# Patient Record
Sex: Male | Born: 1985 | Race: White | Hispanic: No | Marital: Single | Smoking: Current every day smoker
Health system: Southern US, Community
[De-identification: ages and names within clinical notes are randomized; demographics above are authoritative.]

## PROBLEM LIST (undated history)

## (undated) DIAGNOSIS — F419 Anxiety disorder, unspecified: Secondary | ICD-10-CM

## (undated) DIAGNOSIS — M1990 Unspecified osteoarthritis, unspecified site: Secondary | ICD-10-CM

## (undated) DIAGNOSIS — G629 Polyneuropathy, unspecified: Secondary | ICD-10-CM

## (undated) HISTORY — PX: TONSILLECTOMY: SUR1361

---

## 2011-10-07 ENCOUNTER — Emergency Department (HOSPITAL_COMMUNITY): Payer: Self-pay

## 2011-10-07 ENCOUNTER — Emergency Department (HOSPITAL_COMMUNITY)
Admission: EM | Admit: 2011-10-07 | Discharge: 2011-10-07 | Disposition: A | Discharge status: Home / Self Care | Payer: Self-pay | Attending: Emergency Medicine | Admitting: Emergency Medicine

## 2011-10-07 ENCOUNTER — Encounter (HOSPITAL_COMMUNITY): Payer: Self-pay | Admitting: Emergency Medicine

## 2011-10-07 DIAGNOSIS — F17200 Nicotine dependence, unspecified, uncomplicated: Secondary | ICD-10-CM | POA: Insufficient documentation

## 2011-10-07 DIAGNOSIS — R404 Transient alteration of awareness: Secondary | ICD-10-CM | POA: Insufficient documentation

## 2011-10-07 DIAGNOSIS — F1920 Other psychoactive substance dependence, uncomplicated: Secondary | ICD-10-CM | POA: Insufficient documentation

## 2011-10-07 DIAGNOSIS — T1490XA Injury, unspecified, initial encounter: Secondary | ICD-10-CM | POA: Insufficient documentation

## 2011-10-07 DIAGNOSIS — M545 Low back pain, unspecified: Secondary | ICD-10-CM | POA: Insufficient documentation

## 2011-10-07 DIAGNOSIS — J329 Chronic sinusitis, unspecified: Secondary | ICD-10-CM | POA: Insufficient documentation

## 2011-10-07 DIAGNOSIS — K029 Dental caries, unspecified: Secondary | ICD-10-CM | POA: Insufficient documentation

## 2011-10-07 HISTORY — DX: Unspecified osteoarthritis, unspecified site: M19.90

## 2011-10-07 LAB — RAPID URINE DRUG SCREEN, HOSP PERFORMED
Opiates: NOT DETECTED
Tetrahydrocannabinol: NOT DETECTED

## 2011-10-07 LAB — ETHANOL: Alcohol, Ethyl (B): <11 mg/dL (ref 0–11)

## 2011-10-07 MED ORDER — SODIUM CHLORIDE 0.9 % IV BOLUS (SEPSIS)
1000.0000 mL | Freq: Once | INTRAVENOUS | Status: AC
  Administered 2011-10-07: 1000 mL via INTRAVENOUS
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
## 2011-10-07 NOTE — ED Notes (Signed)
Pt denies si.
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
## 2011-10-07 NOTE — ED Notes (Signed)
Patient refusing in/out foley. Stating he can use the bathroom himself. Patient insisted on sitting on side of bed to urinate. Pt nodding in and out of sleep. Unable to use urinal.

## 2011-10-07 NOTE — ED Notes (Addendum)
Patient forgot socks in room. Unable to locate patient after discharge. Socks in patient belonging bag and labeled. Bag located at blue nurse's station. Rn notified

## 2011-10-07 NOTE — ED Notes (Signed)
Attempted in and out cath. Pt woke up and refused cath.  Tried to have pt urinate, but pt unable.  Pt awake, but still drowsy and uncoordinated.
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
## 2011-10-07 NOTE — ED Notes (Signed)
Pt's mother has called and asked to speak w/the pt.  Call was transferred in the room and pt awoke to answer it.  Pt was asleep but will awake to verbal stimuli and is clearly groggy.

## 2011-10-07 NOTE — ED Notes (Signed)
Pt became agitated after talking to family to get ride home.  Security was called and pt was escorted out.
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
## 2011-10-07 NOTE — ED Notes (Signed)
Pt unable to urinate at this time.  

## 2011-10-07 NOTE — ED Provider Notes (Signed)
Patient signed out to me by Dr. Read Drivers. He is arousable and answers questions appropriate leg. Estimated to using excessive amounts of benzodiazepines. Concern was voiced by his parent of possible suicidal ideations however patient denies this states that he has too much to live for. I do not believe that he is suicidal at this time. He'll be discharged in stable  2:40 PM Pt now stable for discharge  Toy Baker, MD 10/07/11 1440

## 2011-12-04 ENCOUNTER — Emergency Department (HOSPITAL_COMMUNITY)
Admission: EM | Admit: 2011-12-04 | Discharge: 2011-12-04 | Payer: Self-pay | Attending: Emergency Medicine | Admitting: Emergency Medicine

## 2011-12-04 ENCOUNTER — Encounter (HOSPITAL_COMMUNITY): Payer: Self-pay | Admitting: Emergency Medicine

## 2011-12-04 DIAGNOSIS — R5381 Other malaise: Secondary | ICD-10-CM | POA: Insufficient documentation

## 2011-12-04 DIAGNOSIS — F17200 Nicotine dependence, unspecified, uncomplicated: Secondary | ICD-10-CM | POA: Insufficient documentation

## 2011-12-04 DIAGNOSIS — T43591A Poisoning by other antipsychotics and neuroleptics, accidental (unintentional), initial encounter: Secondary | ICD-10-CM | POA: Insufficient documentation

## 2011-12-04 DIAGNOSIS — F1910 Other psychoactive substance abuse, uncomplicated: Secondary | ICD-10-CM | POA: Insufficient documentation

## 2011-12-04 DIAGNOSIS — T424X4A Poisoning by benzodiazepines, undetermined, initial encounter: Secondary | ICD-10-CM | POA: Insufficient documentation

## 2011-12-04 DIAGNOSIS — R5383 Other fatigue: Secondary | ICD-10-CM | POA: Insufficient documentation

## 2011-12-04 DIAGNOSIS — T424X1A Poisoning by benzodiazepines, accidental (unintentional), initial encounter: Secondary | ICD-10-CM

## 2011-12-04 LAB — COMPREHENSIVE METABOLIC PANEL
ALT: 7 U/L (ref 0–53)
CO2: 27 mEq/L (ref 19–32)
Calcium: 9.3 mg/dL (ref 8.4–10.5)
GFR calc Af Amer: >90 mL/min (ref >90)
GFR calc non Af Amer: >90 mL/min (ref >90)
Glucose, Bld: 78 mg/dL (ref 70–99)
Sodium: 139 mEq/L (ref 135–145)

## 2011-12-04 LAB — CBC
HCT: 41.4 % (ref 39.0–52.0)
Hemoglobin: 14 g/dL (ref 13.0–17.0)
MCH: 31.1 pg (ref 26.0–34.0)
MCV: 92 fL (ref 78.0–100.0)
RBC: 4.5 MIL/uL (ref 4.22–5.81)

## 2011-12-04 LAB — ACETAMINOPHEN LEVEL: Acetaminophen (Tylenol), Serum: <15 ug/mL (ref 10–30)

## 2011-12-04 LAB — RAPID URINE DRUG SCREEN, HOSP PERFORMED
Barbiturates: NOT DETECTED
Cocaine: POSITIVE — AB

## 2011-12-04 MED ORDER — ACTIDOSE WITH SORBITOL 50 GM/240ML PO LIQD
50.0000 g | Freq: Once | ORAL | Status: AC
  Administered 2011-12-04: 50 g via ORAL
  Filled 2011-12-04: qty 240

## 2011-12-04 MED ORDER — SODIUM CHLORIDE 0.9 % IV SOLN
Freq: Once | INTRAVENOUS | Status: AC
  Administered 2011-12-04: 12:00:00 via INTRAVENOUS

## 2011-12-04 NOTE — ED Notes (Signed)
Attempted to obtain urine sample from pt without success

## 2011-12-04 NOTE — ED Notes (Signed)
Attempted to obtain urine sample from pt.  Pt attempted to go but was unable to go.

## 2011-12-04 NOTE — ED Notes (Signed)
Pt eating dinner, stood and ambulated with no assitance

## 2011-12-04 NOTE — ED Notes (Signed)
MD at bedside. 

## 2011-12-04 NOTE — ED Notes (Signed)
Sheriff bedside with pt.
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
## 2011-12-04 NOTE — ED Provider Notes (Signed)
7 PM Patient is awake alert Glasgow Coma Score 15 ambulate without difficulty. A full meal in the emergency department Discharge is stable condition with law-enforcement  Doug Sou, MD 12/04/11 1610

## 2011-12-04 NOTE — ED Notes (Signed)
Requested urine sample.  

## 2011-12-04 NOTE — ED Notes (Signed)
Sheriff at bedside, pt eating sandwich

## 2011-12-04 NOTE — ED Notes (Signed)
Awaiting d/c paperwork 

## 2011-12-04 NOTE — ED Notes (Signed)
Pt presenting to ed from [facility] jail with officer for medical clearance pt very lethargic and sleepy in triage pt states he took unknown amount of xanax. Pt unable to fully answer questions in triage due.

## 2011-12-12 ENCOUNTER — Encounter (HOSPITAL_COMMUNITY): Payer: Self-pay | Admitting: Emergency Medicine

## 2011-12-12 ENCOUNTER — Emergency Department (HOSPITAL_COMMUNITY)
Admission: EM | Admit: 2011-12-12 | Discharge: 2011-12-13 | Disposition: A | Discharge status: Other Acute Inpatient Hospital | Payer: Self-pay | Attending: Emergency Medicine | Admitting: Emergency Medicine

## 2011-12-12 DIAGNOSIS — F3289 Other specified depressive episodes: Secondary | ICD-10-CM | POA: Insufficient documentation

## 2011-12-12 DIAGNOSIS — F329 Major depressive disorder, single episode, unspecified: Secondary | ICD-10-CM | POA: Insufficient documentation

## 2011-12-12 DIAGNOSIS — Z8739 Personal history of other diseases of the musculoskeletal system and connective tissue: Secondary | ICD-10-CM | POA: Insufficient documentation

## 2011-12-12 DIAGNOSIS — R45851 Suicidal ideations: Secondary | ICD-10-CM | POA: Insufficient documentation

## 2011-12-12 DIAGNOSIS — F17200 Nicotine dependence, unspecified, uncomplicated: Secondary | ICD-10-CM | POA: Insufficient documentation

## 2011-12-12 LAB — COMPREHENSIVE METABOLIC PANEL
ALT: 10 U/L (ref 0–53)
AST: 16 U/L (ref 0–37)
CO2: 29 mEq/L (ref 19–32)
Chloride: 100 mEq/L (ref 96–112)
GFR calc non Af Amer: >90 mL/min (ref >90)
Potassium: 4.4 mEq/L (ref 3.5–5.1)
Sodium: 140 mEq/L (ref 135–145)
Total Bilirubin: 0.5 mg/dL (ref 0.3–1.2)

## 2011-12-12 LAB — CBC
Platelets: 240 10*3/uL (ref 150–400)
RBC: 4.65 MIL/uL (ref 4.22–5.81)
WBC: 17.3 10*3/uL — ABNORMAL HIGH (ref 4.0–10.5)

## 2011-12-12 LAB — RAPID URINE DRUG SCREEN, HOSP PERFORMED
Barbiturates: NOT DETECTED
Tetrahydrocannabinol: NOT DETECTED
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
## 2011-12-13 ENCOUNTER — Encounter (HOSPITAL_COMMUNITY): Payer: Self-pay | Admitting: Licensed Clinical Social Worker

## 2011-12-13 ENCOUNTER — Encounter (HOSPITAL_COMMUNITY): Payer: Self-pay | Admitting: *Deleted

## 2011-12-13 ENCOUNTER — Inpatient Hospital Stay (HOSPITAL_COMMUNITY)
Admission: RE | Admit: 2011-12-13 | Discharge: 2011-12-18 | DRG: 885 | Disposition: A | Discharge status: Home / Self Care | Payer: Medicaid Other | Source: Ambulatory Visit | Attending: Psychiatry | Admitting: Psychiatry

## 2011-12-13 DIAGNOSIS — K039 Disease of hard tissues of teeth, unspecified: Secondary | ICD-10-CM | POA: Diagnosis present

## 2011-12-13 DIAGNOSIS — F1520 Other stimulant dependence, uncomplicated: Secondary | ICD-10-CM | POA: Diagnosis present

## 2011-12-13 DIAGNOSIS — F314 Bipolar disorder, current episode depressed, severe, without psychotic features: Secondary | ICD-10-CM | POA: Diagnosis present

## 2011-12-13 DIAGNOSIS — F3113 Bipolar disorder, current episode manic without psychotic features, severe: Principal | ICD-10-CM | POA: Diagnosis present

## 2011-12-13 MED ORDER — ALUM & MAG HYDROXIDE-SIMETH 200-200-20 MG/5ML PO SUSP: 30.0000 mL | ORAL | Status: DC | PRN

## 2011-12-13 MED ORDER — CITALOPRAM HYDROBROMIDE 20 MG PO TABS
20.0000 mg | ORAL_TABLET | Freq: Every day | ORAL | Status: DC
  Administered 2011-12-13: 20 mg via ORAL
  Filled 2011-12-13 (×2): qty 1

## 2011-12-13 MED ORDER — NICOTINE 21 MG/24HR TD PT24
21.0000 mg | MEDICATED_PATCH | Freq: Every day | TRANSDERMAL | Status: DC
Start: 2011-12-13 — End: 2011-12-18
  Administered 2011-12-14 – 2011-12-18 (×5): 21 mg via TRANSDERMAL
  Filled 2011-12-13 (×7): qty 1

## 2011-12-13 MED ORDER — NICOTINE 21 MG/24HR TD PT24: 21.0000 mg | MEDICATED_PATCH | Freq: Every day | TRANSDERMAL | Status: DC | PRN

## 2011-12-13 MED ORDER — ZOLPIDEM TARTRATE 5 MG PO TABS: 5.0000 mg | ORAL_TABLET | Freq: Every evening | ORAL | Status: DC | PRN

## 2011-12-13 MED ORDER — LORAZEPAM 1 MG PO TABS
1.0000 mg | ORAL_TABLET | Freq: Three times a day (TID) | ORAL | Status: DC | PRN
  Administered 2011-12-13: 1 mg via ORAL
  Filled 2011-12-13: qty 1

## 2011-12-13 MED ORDER — HYDROXYZINE HCL 25 MG PO TABS
25.0000 mg | ORAL_TABLET | Freq: Three times a day (TID) | ORAL | Status: DC | PRN
  Administered 2011-12-13 – 2011-12-15 (×6): 25 mg via ORAL

## 2011-12-13 MED ORDER — NICOTINE 21 MG/24HR TD PT24
MEDICATED_PATCH | TRANSDERMAL | Status: AC
  Administered 2011-12-13: 20:00:00
  Filled 2011-12-13: qty 1

## 2011-12-13 MED ORDER — ACETAMINOPHEN 325 MG PO TABS: 650.0000 mg | ORAL_TABLET | ORAL | Status: DC | PRN

## 2011-12-13 MED ORDER — MAGNESIUM HYDROXIDE 400 MG/5ML PO SUSP: 30.0000 mL | Freq: Every day | ORAL | Status: DC | PRN

## 2011-12-13 MED ORDER — IBUPROFEN 200 MG PO TABS: 400.0000 mg | ORAL_TABLET | Freq: Three times a day (TID) | ORAL | Status: DC | PRN

## 2011-12-13 MED ORDER — ACETAMINOPHEN 325 MG PO TABS
650.0000 mg | ORAL_TABLET | Freq: Four times a day (QID) | ORAL | Status: DC | PRN
  Administered 2011-12-13 – 2011-12-14 (×3): 650 mg via ORAL

## 2011-12-13 MED ORDER — ONDANSETRON HCL 4 MG PO TABS: 4.0000 mg | ORAL_TABLET | Freq: Three times a day (TID) | ORAL | Status: DC | PRN

## 2011-12-13 NOTE — ED Notes (Signed)
telepsych information called into [facility]

## 2011-12-13 NOTE — Progress Notes (Signed)
Psychoeducational Group Note  Date:  12/13/2011 Time:  2000  Group Topic/Focus:  Wrap-Up Group:   The focus of this group is to help patients review their daily goal of treatment and discuss progress on daily workbooks.  Participation Level: Did Not Attend  Participation Quality:  Not Applicable  Affect:  Not Applicable  Cognitive:  Not Applicable  Insight:  Not Applicable  Engagement in Group: Not Applicable  Additional Comments:  The pr. Was meeting with the nurse when group was held.   Westly Pam 12/13/2011, 11:44 PMBHH Group Notes:  (Counselor/Nursing/MHT/Case Management/Adjunct)  12/13/2011 11:44 PM

## 2011-12-13 NOTE — ED Notes (Signed)
Referral information faxed to [facility] for review

## 2011-12-13 NOTE — ED Notes (Signed)
Pt reported being anxious and agitated medicated per MD orders.
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
## 2011-12-13 NOTE — [facility] Counselor (Signed)
Patient review with Dr. Elsie Saas who is requesting a tele psychiatric consult due to questionable criteria for inpatient stabilization.

## 2011-12-13 NOTE — ED Notes (Signed)
Report called to Luann, RN at [facility] but pt cannot transport until 1800.

## 2011-12-13 NOTE — Progress Notes (Signed)
Pt accepted to [facility] per [facility] for Dr. Otelia Santee.  Pt can go by [facility] since [facility] won't transport to [facility].  Nurse to call report:  [phone].  Outside of traveling orders and paper work that routinely go with pt, no other info is needed.

## 2011-12-13 NOTE — ED Provider Notes (Cosign Needed Addendum)
Pt resting comfortably, nad. Discussed w act team, telepsych pending.   Suzi Roots, MD 12/13/11 0800   telepsych recommends psych admit. celexa 20 mg a day  Suzi Roots, MD 12/13/11 1124   Discussed w act team - they indicate pt accepted to [facility], Dr Otelia Santee, bed ready.  Pt alert, content, nad.   Suzi Roots, MD 12/13/11 1500  Act team called back, they indicate plan has changed, pt accepted to [facility], Dr Shela Commons   Suzi Roots, MD 12/13/11 1535
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
## 2011-12-13 NOTE — ED Notes (Signed)
Pt eating breakfast 

## 2011-12-13 NOTE — ED Notes (Signed)
Pt is accepted to [facility] by Dr. Otelia Santee. Nurse to nurse report given to Jasmine December, RN. Pt is agreeable to go and will transport via [facility] due to voluntary status.

## 2011-12-13 NOTE — ED Notes (Signed)
Pt transported to [facility] via security and a tech. Two bags of belongings sent with him.

## 2011-12-13 NOTE — [facility] Counselor (Signed)
John Christian, ACT counselor at [facility], submitted Pt for admission to [facility]. Consulted with John Christian, [facility] who confirmed bed availability. Gave clinical report to Dr. Mervyn Gay who accepted Pt to the service of Dr. Rueben Bash, room 501-1. Notified Albesa Seen, assessment counselor at [facility], of disposition.  Harlin Rain Patsy [place], LPC

## 2011-12-13 NOTE — ED Notes (Signed)
telepsych being completed now. 

## 2011-12-13 NOTE — ED Notes (Signed)
Pt has been accepted to [facility] as well and when given the option he chose [facility]. Called Sharon RN back at [facility] to let her know that pt wouldn't be coming to their facility after all. Arrangements will be made to transport pt to [facility].

## 2011-12-13 NOTE — ED Provider Notes (Signed)
History     CSN: [id number]  Arrival date & time 12/12/11  2228   First MD Initiated Contact with Patient 12/13/11 0216      Chief Complaint  Patient presents with  . Medical Clearance     HPI Pt was seen at 0250.  Per pt, c/o gradual onset and worsening of persistent depression and SI for the past several weeks, worse over the past several days.  Endorses SI with SA attempt last week.  States he "lied last week when I was in the ER" regarding not being suicidal when he overdosed on "roxy and benzos."  States he took the meds as a SA.  Denies SA today, no HI.     Past Medical History  Diagnosis Date  . Arthritis     Past Surgical History  Procedure Date  . Tonsillectomy     Family History  Problem Relation Age of Onset  . Hypertension Other   . Cancer Other     History  Substance Use Topics  . Smoking status: Current Everyday Smoker -- 1.0 packs/day    Types: Cigarettes  . Smokeless tobacco: Never Used  . Alcohol Use: No      Review of Systems ROS: Statement: All systems negative except as marked or noted in the HPI; Constitutional: Negative for fever and chills. ; ; Eyes: Negative for eye pain, redness and discharge. ; ; ENMT: Negative for ear pain, hoarseness, nasal congestion, sinus pressure and sore throat. ; ; Cardiovascular: Negative for chest pain, palpitations, diaphoresis, dyspnea and peripheral edema. ; ; Respiratory: Negative for cough, wheezing and stridor. ; ; Gastrointestinal: Negative for nausea, vomiting, diarrhea, abdominal pain, blood in stool, hematemesis, jaundice and rectal bleeding. . ; ; Genitourinary: Negative for dysuria, flank pain and hematuria. ; ; Musculoskeletal: Negative for back pain and neck pain. Negative for swelling and trauma.; ; Skin: Negative for pruritus, rash, abrasions, blisters, bruising and skin lesion.; ; Neuro: Negative for headache, lightheadedness and neck stiffness. Negative for weakness, altered level of consciousness ,  altered mental status, extremity weakness, paresthesias, involuntary movement, seizure and syncope.; Psych:  +depression, +SI, +previous SA. No HI, no hallucinations.    Allergies  Penicillins  Home Medications   Current Outpatient Rx  Name Route Sig Dispense Refill  . ACETAMINOPHEN 325 MG PO TABS Oral Take 650 mg by mouth every 6 (six) hours as needed. pain      BP 111/64  Pulse 73  Temp 98.6 F (37 C) (Oral)  Resp 16  SpO2 99%  Physical Exam 0255: Physical examination:  Nursing notes reviewed; Vital signs and O2 SAT reviewed;  Constitutional: Well developed, Well nourished, Well hydrated, In no acute distress; Head:  Normocephalic, atraumatic; Eyes: EOMI, PERRL, No scleral icterus; ENMT: Mouth and pharynx normal, Mucous membranes moist; Neck: Supple, Full range of motion, No lymphadenopathy; Cardiovascular: Regular rate and rhythm, No murmur, rub, or gallop; Respiratory: Breath sounds clear & equal bilaterally, No rales, rhonchi, wheezes.  Speaking full sentences with ease, Normal respiratory effort/excursion; Chest: Nontender, Movement normal; Abdomen: Soft, Nontender, Nondistended, Normal bowel sounds;; Extremities: Pulses normal, No tenderness, No edema, No calf edema or asymmetry.; Neuro: AA&Ox3, Major CN grossly intact.  Speech clear. No gross focal motor or sensory deficits in extremities.; Skin: Color normal, Warm, Dry.; Psych:  Affect flat, +depression, +SI.    ED Course  Procedures    MDM  MDM Reviewed: previous chart, nursing note and vitals Interpretation: labs   Results for orders placed during the  hospital encounter of 12/12/11  CBC      Component Value Range   WBC 17.3 (*) 4.0 - 10.5 K/uL   RBC 4.65  4.22 - 5.81 MIL/uL   Hemoglobin 14.6  13.0 - 17.0 g/dL   HCT 40.9  81.1 - 91.4 %   MCV 91.2  78.0 - 100.0 fL   MCH 31.4  26.0 - 34.0 pg   MCHC 34.4  30.0 - 36.0 g/dL   RDW 78.2  95.6 - 21.3 %   Platelets 240  150 - 400 K/uL  COMPREHENSIVE METABOLIC PANEL       Component Value Range   Sodium 140  135 - 145 mEq/L   Potassium 4.4  3.5 - 5.1 mEq/L   Chloride 100  96 - 112 mEq/L   CO2 29  19 - 32 mEq/L   Glucose, Bld 97  70 - 99 mg/dL   BUN 15  6 - 23 mg/dL   Creatinine, Ser 0.86  0.50 - 1.35 mg/dL   Calcium 9.9  8.4 - 57.8 mg/dL   Total Protein 7.7  6.0 - 8.3 g/dL   Albumin 4.8  3.5 - 5.2 g/dL   AST 16  0 - 37 U/L   ALT 10  0 - 53 U/L   Alkaline Phosphatase 68  39 - 117 U/L   Total Bilirubin 0.5  0.3 - 1.2 mg/dL   GFR calc non Af Amer >90  >90 mL/min   GFR calc Af Amer >90  >90 mL/min  ETHANOL      Component Value Range   Alcohol, Ethyl (B) <11  0 - 11 mg/dL  URINE RAPID DRUG SCREEN (HOSP PERFORMED)      Component Value Range   Opiates NONE DETECTED  NONE DETECTED   Cocaine NONE DETECTED  NONE DETECTED   Benzodiazepines NONE DETECTED  NONE DETECTED   Amphetamines NONE DETECTED  NONE DETECTED   Tetrahydrocannabinol NONE DETECTED  NONE DETECTED   Barbiturates NONE DETECTED  NONE DETECTED     0300:  ACT aware of pt, will eval. Holding orders written.            Laray Anger, DO 12/13/11 [phone]

## 2011-12-14 ENCOUNTER — Encounter (HOSPITAL_COMMUNITY): Payer: Self-pay | Admitting: *Deleted

## 2011-12-14 DIAGNOSIS — F314 Bipolar disorder, current episode depressed, severe, without psychotic features: Secondary | ICD-10-CM | POA: Diagnosis present

## 2011-12-14 MED ORDER — QUETIAPINE FUMARATE 100 MG PO TABS
100.0000 mg | ORAL_TABLET | Freq: Every day | ORAL | Status: DC
Start: 2011-12-14 — End: 2011-12-15
  Administered 2011-12-14: 100 mg via ORAL
  Filled 2011-12-14 (×2): qty 1

## 2011-12-14 MED ORDER — CARBAMAZEPINE 200 MG PO TABS
200.0000 mg | ORAL_TABLET | Freq: Three times a day (TID) | ORAL | Status: DC
  Administered 2011-12-14 – 2011-12-15 (×5): 200 mg via ORAL
  Filled 2011-12-14 (×9): qty 1

## 2011-12-14 NOTE — Progress Notes (Signed)
Report received from Judieth Keens RN. Writer received admission orders and an order for visteril prn. Writer informed patient of medication available and he requested a dose of visteril d/t feeling anxious and tylenol for lower left tooth pain. Patient received medications, informed he will see a Dr/NP on tomorrow who will assess and start medications if needed. Patient informed he can receive prn visteril 3 times daily if needed. Support and encouragement offered, safety maintained on unit. Will continue to monitor.

## 2011-12-14 NOTE — Progress Notes (Signed)
Psychoeducational Group Note  Date:  12/14/2011 Time:  0930  Group Topic/Focus:  Spirituality:   The focus of this group is to discuss how one's spirituality can aide in recovery.  Participation Level:  Did Not Attend  Participation Quality:  Appropriate  Affect:  Appropriate  Cognitive:  Appropriate  Insight:  DId not attend  Engagement in Group:  Did not attend  Additional Comments:  Did not Attend   Meredith Staggers 12/14/2011, 11:19 AM

## 2011-12-14 NOTE — H&P (Signed)
Progress note was performed by physician extender and as a supervising MD, available for assistance near by during this rounds.

## 2011-12-14 NOTE — Tx Team (Signed)
Initial Interdisciplinary Treatment Plan  PATIENT STRENGTHS: (choose at least two) Average or above average intelligence Communication skills General fund of knowledge Work skills  PATIENT STRESSORS: Educational concerns Paediatric nurse issue Loss of job, home and relationship with daughter* Substance abuse   PROBLEM LIST: Problem List/Patient Goals Date to be addressed Date deferred Reason deferred Estimated date of resolution  Depression with suicidal ideation 12/13/11                                                      DISCHARGE CRITERIA:  Adequate post-discharge living arrangements Improved stabilization in mood, thinking, and/or behavior Verbal commitment to aftercare and medication compliance  PRELIMINARY DISCHARGE PLAN: Attend 12-step recovery group Outpatient therapy Placement in alternative living arrangements  PATIENT/FAMIILY INVOLVEMENT: This treatment plan has been presented to and reviewed with the patient, John Christian, John Christian 12/14/2011, 2:02 AM

## 2011-12-14 NOTE — H&P (Signed)
Psychiatric Admission Assessment Adult  Patient Identification:  John Christian  Date of Evaluation:  12/14/2011  Chief Complaint:  MDD,REC,SEV  History of Present Illness: This is a 26 year old Caucasian male, admitted to [facility] from the [facility] ED with reports of suicidal ideations and increased depression. Patient reports, "I went to the [facility] ED on the 12/12/11. I was having suicidal thoughts. I have been having suicidal thoughts off and on for a long time. I am now severely depressed x 2 weeks. I have a lot of stressors contributing to my depression and mood swings. My family dis-owned me because of my behaviors and drug use. I have a daughter that I am not allowed to see because of my behavior. I have been in and out of prison for burglary, drug possession and shop lifting. I just got out of prison on 07/20/11 of this year. I am currently homeless. I have mental illness. This has been evidence since my childhood. I was at the [facility] as young as 26 years old. I was not monitored, raised well and had any sense of direction growing up. I did not have any role models.  I did what I know how to do and that was, hang with the wrong crowd. I was at the [facility] in 2009 for my anger problems. I have been diagnosed with manic depression in the past. I have racing thoughts, mood swings and I don't sleep at night. I did not finish high school and I am unemployed. I attempted suicide 2 weeks ago by overdose on Xanax. My drug of choice of is Meth. Although,  I have dabbled on any and other kinds of illegal drugs".  ROS: Negative for fever.  HENT: Negative for congestion and rhinorrhea. Poor dentition (rotten broken teeth with numerous). Respiratory: Negative for cough, chest tightness and shortness of breath.  Cardiovascular: Negative for chest pain.  Gastrointestinal: Negative for nausea, vomiting and abdominal pain.  Skin: Negative for rash.  Neurological: Negative for  weakness and headaches    Mood Symptoms:  Helplessness, Hopelessness, Hypomania/Mania, Mood Swings, Past 2 Weeks, Sadness, SI, Worthlessness,  Depression Symptoms:  depressed mood, difficulty concentrating, suicidal thoughts with specific plan, suicidal attempt, anxiety, insomnia, disturbed sleep, weight loss, increased appetite,  (Hypo) Manic Symptoms:  Distractibility, Elevated Mood, Impulsivity, Irritable Mood,  Anxiety Symptoms:  Excessive Worry,  Psychotic Symptoms:  Hallucinations: None  PTSD Symptoms: Had a traumatic exposure:  "My mother's battles with Ca traumatised me"  Past Psychiatric History: Diagnosis: Bipolar affective disorder, manic episodes, severe, polysubstance abuse and dependency.  Hospitalizations: [facility], [facility] at the age of 54, [facility] in 2009 for anger issues.  Outpatient Care: "I do not have one"  Substance Abuse Care: None reported  Self-Mutilation: None reported  Suicidal Attempts: "Yes, 2 weeks ago by overdose"  Violent Behaviors: None reported   Past Medical History:   Past Medical History  Diagnosis Date  . Arthritis      Allergies:   Allergies  Allergen Reactions  . Penicillins Itching   PTA Medications: Prescriptions prior to admission  Medication Sig Dispense Refill  . acetaminophen (TYLENOL) 325 MG tablet Take 650 mg by mouth every 6 (six) hours as needed. pain         Substance Abuse History in the last 12 months: Substance Age of 1st Use Last Use Amount Specific Type  Nicotine 10 Prior to hosp 2 packs daily Cigarettes  Alcohol "I  rarely drink alcohol"     Cannabis Denies use     Opiates Denies use     Cocaine Denies use     Methamphetamines 21 Prior to hosp "I use daily as many times as I can and as much as I can use" Speed  LSD Denies use     Ecstasy Denies use     Benzodiazepines "I attempted to overdose on Xanax 2 weeks ago"     Caffeine      Inhalants      Others:                          Consequences of Substance Abuse: Medical Consequences:  Liver damage, Legal Consequences:  Arrests, jail time Family Consequences:  Family discord  Social History: Current Place of Residence: [place],   Scientist, research (physical sciences) of Birth: [place]   Family Members: "None of my family members want to deal with me, I have a daughter".  Marital Status:  Single  Children: 1  Sons:0  Daughters:1  Relationships: Single  Education:  No high school diploma  Educational Problems/Performance: No high school diploma  Religious Beliefs/Practices: None reported  History of Abuse (Emotional/Phsycial/Sexual): "I was not raised well, monitored or guided during my childhood"  Occupational Experiences: unemployed  Hotel manager History:  None.  Legal History: "I have a long history of prison times for drugs, burglary etc'  Hobbies/Interests: None reported  Family History:   Family History  Problem Relation Age of Onset  . Hypertension Other   . Cancer Other     Mental Status Examination/Evaluation: Objective:  Appearance: Disheveled and Emaciated  Eye Contact::  Good  Speech:  Clear and Coherent  Volume:  Increased  Mood:  Depressed and Dysphoric  Affect:  Flat  Thought Process:  Coherent  Orientation:  Full  Thought Content:  Rumination  Suicidal Thoughts:  No  Homicidal Thoughts:  No  Memory:  Immediate;   Good Recent;   Good Remote;   Good  Judgement:  Impaired  Insight:  Lacking  Psychomotor Activity:  Restlessness  Concentration:  Poor  Recall:  Good  Akathisia:  No  Handed:  Right  AIMS (if indicated):     Assets:  Desire for Improvement  Sleep:  Number of Hours: 6     Laboratory/X-Ray: None Psychological Evaluation(s)      Assessment:    AXIS I:  Bipolar affective disorder, manic episode, polysubstance abuse and dependency. AXIS II:  Deferred AXIS III:   Past Medical History  Diagnosis Date  . Arthritis    AXIS IV:  economic problems, educational  problems, housing problems, occupational problems and other psychosocial or environmental problems AXIS V:  11-20 some danger of hurting self or others possible OR occasionally fails to maintain minimal personal hygiene OR gross impairment in communication  Treatment Plan/Recommendations: Admit for safety and stabilization. Review and reinstate any pertinent home medications for other health issues. Start Tegretol 200 mg bid for mood stabilization. Seroquel 100 mg Q hs for mood control.  Treatment Plan Summary: Daily contact with patient to assess and evaluate symptoms and progress in treatment Medication management  Current Medications:  Current Facility-Administered Medications  Medication Dose Route Frequency Provider Last Rate Last Dose  . acetaminophen (TYLENOL) tablet 650 mg  650 mg Oral Q6H PRN Sanjuana Kava, NP   650 mg at 12/14/11 0650  . alum & mag hydroxide-simeth (MAALOX/MYLANTA) 200-200-20 MG/5ML suspension 30 mL  30 mL Oral Q4H PRN  Sanjuana Kava, NP      . hydrOXYzine (ATARAX/VISTARIL) tablet 25 mg  25 mg Oral TID PRN Sanjuana Kava, NP   25 mg at 12/13/11 2247  . magnesium hydroxide (MILK OF MAGNESIA) suspension 30 mL  30 mL Oral Daily PRN Sanjuana Kava, NP      . nicotine (NICODERM CQ - dosed in mg/24 hours) 21 mg/24hr patch           . nicotine (NICODERM CQ - dosed in mg/24 hours) patch 21 mg  21 mg Transdermal Q0600 Mike Craze, MD   21 mg at 12/14/11 4098   Facility-Administered Medications Ordered in Other Encounters  Medication Dose Route Frequency Provider Last Rate Last Dose  . DISCONTD: acetaminophen (TYLENOL) tablet 650 mg  650 mg Oral Q4H PRN Laray Anger, DO      . DISCONTD: alum & mag hydroxide-simeth (MAALOX/MYLANTA) 200-200-20 MG/5ML suspension 30 mL  30 mL Oral PRN Laray Anger, DO      . DISCONTD: citalopram (CELEXA) tablet 20 mg  20 mg Oral Daily Suzi Roots, MD   20 mg at 12/13/11 1359  . DISCONTD: ibuprofen (ADVIL,MOTRIN) tablet 400 mg   400 mg Oral Q8H PRN Laray Anger, DO      . DISCONTD: LORazepam (ATIVAN) tablet 1 mg  1 mg Oral Q8H PRN Laray Anger, DO   1 mg at 12/13/11 1402  . DISCONTD: nicotine (NICODERM CQ - dosed in mg/24 hours) patch 21 mg  21 mg Transdermal Daily PRN Laray Anger, DO      . DISCONTD: ondansetron [facility]) tablet 4 mg  4 mg Oral Q8H PRN Laray Anger, DO      . DISCONTD: zolpidem (AMBIEN) tablet 5 mg  5 mg Oral QHS PRN Laray Anger, DO        Observation Level/Precautions:  Q 15 minute checks for safety  Laboratory:  Reviewed  ED lab findings on file  Psychotherapy:  Group sessions  Medications:  See medication lists  Routine PRN Medications:  Yes  Consultations:  None indicated at this time  Discharge Concerns: Safety   Other:     Armandina Stammer I 8/25/201311:31 AM

## 2011-12-14 NOTE — Progress Notes (Signed)
D Lorin is seen today on the 500 hall. He is pale, anxious and obviously uncomfortable. HE avoids eye contact. He shares that he has been dealing with mental problems 'my whole life' and that he feels very 'agitated right now'. He has been in his room most of the day. He states he 'has' to have someting for sleep tonight. He is restless, figidity and paces. HE demonstrates poor insight as well as inability to control his impulsiveness.             A He completes his self inventory and on it he wrote he cont to have " of and on" SI within the past 24 hrs, he rates his depression and hopelessness "9/9" and states his DC plan is to stay off drugs , spend more time out in public socially.              R Safety is in place and POC includes continuing to foster therapeutic relationship PD RN [facility]

## 2011-12-14 NOTE — Progress Notes (Signed)
[facility] Group Notes:  (Counselor/Nursing/MHT/Case Management/Adjunct)  12/14/2011 5:37 PM  Type of Therapy:  Group Therapy  Participation Level:  Active  Participation Quality:  Appropriate and Attentive  Affect:  Appropriate  Cognitive:  Appropriate  Insight:  Limited  Engagement in Group:  Good  Engagement in Therapy:  Good  Modes of Intervention:  Clarification, Education, Socialization and Support  Summary of Progress/Problems: Pt. participated in group on health support systems and how  they(patients) can support themselves when their supports are not around. The patients also shared what the word support " meant to them".  Each pt. Also shared who they had as a support in their life and participated in a support activity and what it me and to actually feel support from someone. Pt. Stated his family is his support.  Neila Gear 12/14/2011, 5:37 PM

## 2011-12-15 DIAGNOSIS — F1520 Other stimulant dependence, uncomplicated: Secondary | ICD-10-CM | POA: Diagnosis present

## 2011-12-15 DIAGNOSIS — F314 Bipolar disorder, current episode depressed, severe, without psychotic features: Secondary | ICD-10-CM

## 2011-12-15 MED ORDER — HYDROXYZINE HCL 50 MG PO TABS
50.0000 mg | ORAL_TABLET | Freq: Three times a day (TID) | ORAL | Status: DC
  Administered 2011-12-15 – 2011-12-16 (×3): 50 mg via ORAL
  Filled 2011-12-15 (×5): qty 1

## 2011-12-15 MED ORDER — MELOXICAM 7.5 MG PO TABS
7.5000 mg | ORAL_TABLET | Freq: Every day | ORAL | Status: DC
  Administered 2011-12-15 – 2011-12-18 (×2): 7.5 mg via ORAL
  Filled 2011-12-15 (×6): qty 1

## 2011-12-15 MED ORDER — HYDROXYZINE HCL 50 MG PO TABS: 50.0000 mg | ORAL_TABLET | Freq: Three times a day (TID) | ORAL | Status: DC | PRN

## 2011-12-15 MED ORDER — HYDROXYZINE HCL 25 MG PO TABS: 25.0000 mg | ORAL_TABLET | Freq: Three times a day (TID) | ORAL | Status: DC | PRN

## 2011-12-15 MED ORDER — IBUPROFEN 600 MG PO TABS
600.0000 mg | ORAL_TABLET | Freq: Four times a day (QID) | ORAL | Status: DC | PRN
  Administered 2011-12-16: 600 mg via ORAL
  Filled 2011-12-15: qty 1

## 2011-12-15 MED ORDER — HYDROXYZINE HCL 25 MG PO TABS
25.0000 mg | ORAL_TABLET | ORAL | Status: AC
  Administered 2011-12-15: 25 mg via ORAL

## 2011-12-15 MED ORDER — CARBAMAZEPINE 200 MG PO TABS
200.0000 mg | ORAL_TABLET | ORAL | Status: AC
  Administered 2011-12-15: 200 mg via ORAL
  Filled 2011-12-15: qty 1

## 2011-12-15 MED ORDER — BENZOCAINE 10 % MT GEL
Freq: Four times a day (QID) | OROMUCOSAL | Status: DC | PRN
  Filled 2011-12-15: qty 9.4

## 2011-12-15 MED ORDER — BOOST PLUS PO LIQD
237.0000 mL | Freq: Three times a day (TID) | ORAL | Status: DC
  Administered 2011-12-15: 237 mL via ORAL
  Filled 2011-12-15 (×5): qty 237

## 2011-12-15 MED ORDER — GRX ANALGESIC BALM EX OINT
1.0000 "application " | TOPICAL_OINTMENT | CUTANEOUS | Status: DC | PRN
  Filled 2011-12-15: qty 28

## 2011-12-15 MED ORDER — CARBAMAZEPINE 200 MG PO TABS
200.0000 mg | ORAL_TABLET | Freq: Three times a day (TID) | ORAL | Status: DC
  Administered 2011-12-16 – 2011-12-18 (×8): 200 mg via ORAL
  Filled 2011-12-15 (×11): qty 1

## 2011-12-15 MED ORDER — PANTOPRAZOLE SODIUM 20 MG PO TBEC
20.0000 mg | DELAYED_RELEASE_TABLET | Freq: Two times a day (BID) | ORAL | Status: DC
  Administered 2011-12-16 – 2011-12-18 (×5): 20 mg via ORAL
  Filled 2011-12-15 (×8): qty 1

## 2011-12-15 MED ORDER — AMITRIPTYLINE HCL 25 MG PO TABS
25.0000 mg | ORAL_TABLET | Freq: Every day | ORAL | Status: DC
  Administered 2011-12-15 – 2011-12-17 (×3): 25 mg via ORAL
  Filled 2011-12-15 (×4): qty 1

## 2011-12-15 NOTE — Tx Team (Signed)
Interdisciplinary Treatment Plan Update (Adult)  Date:  12/15/2011  Time Reviewed:  11:04 AM   Progress in Treatment: Attending groups: Yes Participating in groups:  Yes Taking medication as prescribed: Yes Tolerating medication:  Yes Family/Significant other contact made:  Counselor assessing for appropriate contact Patient understands diagnosis:  Yes Discussing patient identified problems/goals with staff:  Yes Medical problems stabilized or resolved:  Yes Denies suicidal/homicidal ideation: Yes Issues/concerns per patient self-inventory:  None identified Other: N/A  New problem(s) identified: None Identified  Reason for Continuation of Hospitalization: Anxiety Depression Medication stabilization Suicidal ideation  Interventions implemented related to continuation of hospitalization: mood stabilization, medication monitoring and adjustment, group therapy and psycho education, safety checks q 15 mins  Additional comments: N/A  Estimated length of stay: 3-5 days  Discharge Plan: SW will assess for appropriate referrals.    New goal(s): N/A  Review of initial/current patient goals per problem list:    1.  Goal(s): Reduce depressive and anxiety symptoms  Met:  No  Target date: by discharge  As evidenced by: Reducing depression from a 10 to a 3 as reported by pt.   2.  Goal (s): Reduce/Eliminate suicidal ideation  Met:  No  Target date: by discharge  As evidenced by: pt reporting no SI.    3.  Goal(s): Address substance abuse  Met:  No  Target date: by discharge  As evidenced by: Referring to appropriate referrals.     Attendees: Patient:  John Christian 12/15/2011 11:05 AM   Family:     Physician:  Orson Aloe, MD  12/15/2011  11:04 AM   Nursing:   Quintella Reichert, RN 12/15/2011 11:05 AM   Case Manager:  Reyes Ivan, LCSWA 12/15/2011  11:04 AM   Counselor:  Angus Palms, LCSW 12/15/2011  11:04 AM   Other:  Juline Patch, LCSW 12/15/2011  11:04 AM     Other:  Lawson Fiscal, RN 12/15/2011 11:05 AM   Other:     Other:      Scribe for Treatment Team:   Carmina Miller, 12/15/2011 , 11:04 AM

## 2011-12-15 NOTE — Progress Notes (Signed)
Patient ID: John Christian, male   DOB: 1985-10-07, 26 y.o.   MRN: [id number]   D: Patient pleasant on approach tonight. States that he is not going to take the Mobic anymore because it made him feel bad today. Still reporting depressed mood but better than this am. Reported some passive SI on and off but denying this afternoon. Stating that the tegretol and vistaril are helping him. Went to [employer]. A: Staff will monitor on q 15 minute checks and follow treatment and give medications as ordered. R: Took bedtime medications and went to bed at this time.

## 2011-12-15 NOTE — Progress Notes (Signed)
Psychoeducational Group Note  Date:  12/15/2011 Time:  1100  Group Topic/Focus:  Wellness Toolbox:   The focus of this group is to discuss various aspects of wellness, balancing those aspects and exploring ways to increase the ability to experience wellness.  Patients will create a wellness toolbox for use upon discharge.  Participation Level:  Did Not Attend  Participation Quality:    Affect:    Cognitive:    Insight:    Engagement in Group:    Additional Comments:  none  Patric Vanpelt M 12/15/2011, 12:15 PM
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
## 2011-12-15 NOTE — Progress Notes (Signed)
[facility] MD Progress Note  12/15/2011 5:13 PM  Diagnosis:   Axis I: Bipolar, Depressed and Methamphetamine Dependence Axis II: Deferred Axis III:  Past Medical History  Diagnosis Date  . Arthritis    Axis IV: other psychosocial or environmental problems Axis V: 41-50 serious symptoms  ADL's:  Intact  Sleep: Poor  Appetite:  Fair  Suicidal Ideation:  Pt denies any thoughts, plans, intent of suicide Homicidal Ideation:  Pt denies any thoughts, plans, intent of homicide  AEB (as evidenced by):per pt report23  Mental Status Examination/Evaluation: Objective:  Appearance: Casual  Eye Contact::  Good  Speech:  Clear and Coherent  Volume:  Normal  Mood:  Anxious, Depressed, Hopeless, Irritable and Worthless  Affect:  Congruent  Thought Process:  Coherent  Orientation:  Full  Thought Content:  WDL  Suicidal Thoughts:  No  Homicidal Thoughts:  No  Memory:  Immediate;   Fair Recent;   Fair Remote;   Fair  Judgement:  Impaired  Insight:  Lacking  Psychomotor Activity:  Normal  Concentration:  Fair  Recall:  Fair  Akathisia:  No  Handed:  Right  AIMS (if indicated):     Assets:  Communication Skills Desire for Improvement  Sleep:  Number of Hours: 6    Vital Signs:Blood pressure 124/83, pulse 73, temperature 97.6 F (36.4 C), temperature source Oral, resp. rate 16, height 6\' 1"  (1.854 m), weight 59.421 kg (131 lb). Current Medications: Current Facility-Administered Medications  Medication Dose Route Frequency Provider Last Rate Last Dose  . alum & mag hydroxide-simeth (MAALOX/MYLANTA) 200-200-20 MG/5ML suspension 30 mL  30 mL Oral Q4H PRN Sanjuana Kava, NP      . amitriptyline (ELAVIL) tablet 25 mg  25 mg Oral QHS Mike Craze, MD      . benzocaine (ORAJEL) 10 % mucosal gel   Mouth/Throat QID PRN Mike Craze, MD      . carbamazepine (TEGRETOL) tablet 200 mg  200 mg Oral TID Mike Craze, MD      . carbamazepine (TEGRETOL) tablet 200 mg  200 mg Oral NOW Mike Craze, MD      . Oletta Cohn ANALGESIC BALM OINT 1 application  1 application Apply externally PRN Mike Craze, MD      . hydrOXYzine (ATARAX/VISTARIL) tablet 25 mg  25 mg Oral NOW Mike Craze, MD   25 mg at 12/15/11 1023  . hydrOXYzine (ATARAX/VISTARIL) tablet 25 mg  25 mg Oral TID PRN Mike Craze, MD      . hydrOXYzine (ATARAX/VISTARIL) tablet 50 mg  50 mg Oral TID Mike Craze, MD      . ibuprofen (ADVIL,MOTRIN) tablet 600 mg  600 mg Oral Q6H PRN Mike Craze, MD      . lactose free nutrition (BOOST PLUS) liquid 237 mL  237 mL Oral TID WC Lavena Bullion, RD      . magnesium hydroxide (MILK OF MAGNESIA) suspension 30 mL  30 mL Oral Daily PRN Sanjuana Kava, NP      . meloxicam (MOBIC) tablet 7.5 mg  7.5 mg Oral Daily Mike Craze, MD      . nicotine (NICODERM CQ - dosed in mg/24 hours) patch 21 mg  21 mg Transdermal Q0600 Mike Craze, MD   21 mg at 12/15/11 0981  . pantoprazole (PROTONIX) EC tablet 20 mg  20 mg Oral BID AC Mike Craze, MD      . DISCONTD: acetaminophen (TYLENOL)  tablet 650 mg  650 mg Oral Q6H PRN Sanjuana Kava, NP   650 mg at 12/14/11 1506  . DISCONTD: carbamazepine (TEGRETOL) tablet 200 mg  200 mg Oral TID Sanjuana Kava, NP   200 mg at 12/15/11 1252  . DISCONTD: hydrOXYzine (ATARAX/VISTARIL) tablet 25 mg  25 mg Oral TID PRN Sanjuana Kava, NP   25 mg at 12/15/11 2725  . DISCONTD: hydrOXYzine (ATARAX/VISTARIL) tablet 50 mg  50 mg Oral TID PRN Mike Craze, MD      . DISCONTD: QUEtiapine (SEROQUEL) tablet 100 mg  100 mg Oral QHS Sanjuana Kava, NP   100 mg at 12/14/11 2135    Lab Results: No results found for this or any previous visit (from the past 48 hour(s)).  Physical Findings: AIMS: Facial and Oral Movements Muscles of Facial Expression: None, normal Lips and Perioral Area: None, normal Jaw: None, normal Tongue: None, normal,Extremity Movements Upper (arms, wrists, hands, fingers): None, normal Lower (legs, knees, ankles, toes): None, normal, Trunk  Movements Neck, shoulders, hips: None, normal, Overall Severity Severity of abnormal movements (highest score from questions above): None, normal Incapacitation due to abnormal movements: None, normal Patient's awareness of abnormal movements (rate only patient's report): No Awareness, Dental Status Current problems with teeth and/or dentures?: No Does patient usually wear dentures?: No  CIWA:  CIWA-Ar Total: 2  COWS:  COWS Total Score: 0   Treatment Plan Summary: Daily contact with patient to assess and evaluate symptoms and progress in treatment Medication management No signs/symptoms of withdrawal from abusable substances. Mood/anxiety less than 3/10 where the scale is 1 is the best and 10 is the worst  Plan: Start Elavil at [facility] for pain control along with Motrin and Mobic for back pain as well as Oragel for oral pain. Stop Seroquel for sleep and see how Elavil works for him. Start going to 12 Step meetings.  Anni Hocevar 12/15/2011, 5:13 PM

## 2011-12-15 NOTE — Progress Notes (Signed)
SW met with pt during treatment team on this date.  Pt was open with sharing reason for entering the hospital.  Pt states that he's been using meth and xanax.  Pt states that he has used meth since he was 67 but more heavily since he was 26 years old.  Pt states that he was feeling depressed and suicidal and knew that wasn't normal and came to the ED to get help.  Pt states that he was living in [place] with his mother but is unable to go back.  Pt rates depression at a 7 and anxiety at a 10 today.  Pt denies SI now, but states he was having SI this morning.  Pt states that he wants long term treatment after here.  SW will assess for appropriate referrals.  No further needs voiced by pt at this time.   John Christian, LCSWA 12/15/2011  11:39 AM

## 2011-12-15 NOTE — Progress Notes (Signed)
Nutrition Brief Note  Patient identified on the Malnutrition Screening Tool (MST) report for unintended weight loss, generating a score of 3.   Body mass index is 17.28 kg/(m^2). Pt meets criteria for underweight based on current BMI.   - Pt reports eating well well PTA, as much as he could. Pt states he was only homeless for 1 day. Pt reports he did the cooking for himself. Pt reports 39 pound unintended weight loss in the past 5 months r/t drug abuse. Pt reports he has bad teeth but denies any problems chewing/swallowing. Pt interested in getting nutritional supplementation to improve nutrition.   Pt meets criteria for severe PCM of chronic illness AEB 22.9% weight loss in the past 5 months per pt report and pt with severe muscle and fat loss.   Intervention: Boost Plus TID. Encouraged increased intake of high calorie/protein foods during mealtimes and at discharge.  No further nutrition intervention indicated at this time.   Dietitian# [phone]
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
## 2011-12-15 NOTE — Progress Notes (Signed)
[facility] Group Notes:  (Counselor/Nursing/MHT/Case Management/Adjunct) 12/15/2011  1:15pm-2:30pm Overcoming Obstacles to Wellness   Type of Therapy:  Group Therapy  Participation Level:  Did Not Attend     John Shiroma, LCSW 12/15/2011  3:56 PM 

## 2011-12-15 NOTE — Progress Notes (Signed)
[facility] Group Notes:  (Counselor/Nursing/MHT/Case Management/Adjunct)  12/15/2011 2:21 AM  Type of Therapy:  Psychoeducational Skills  Participation Level:  Active  Participation Quality:  Appropriate  Affect:  Appropriate  Cognitive:  Appropriate  Insight:  Good  Engagement in Group:  Good  Engagement in Therapy:  Good  Modes of Intervention:  Support  Summary of Progress/Problems: Pt attended the 2000 Group despite telling a Nurse that he did not want to go but would try anyway. Pt stated that his goal this week was to have medication that helped him to feel better.   Christ Kick 12/15/2011, 2:21 AM

## 2011-12-15 NOTE — Progress Notes (Signed)
Patient observed sitting in the dayroom watching tv but no interaction with peers. Writer spoke with patient privately and inquired as to how he was doing and feeling. Patient reported that he has had problems with feeling anxious off and on all day. Patient reports that he has dealt with this for a long time. He reported that the dr started him on 2 medications today to help with this. Patient informed that he has prn visteril available  And he reports that he would like a dose closer to bedtime since he did not rest good at all last night. Patient is very polite but appears guarded and anxious. Support and encouragement offered, safety maintained on unit, will continue to monitor.

## 2011-12-16 MED ORDER — HYDROXYZINE HCL 25 MG PO TABS
25.0000 mg | ORAL_TABLET | Freq: Three times a day (TID) | ORAL | Status: DC
  Administered 2011-12-16 – 2011-12-17 (×3): 25 mg via ORAL
  Filled 2011-12-16 (×8): qty 1

## 2011-12-16 MED ORDER — ENSURE COMPLETE PO LIQD
237.0000 mL | Freq: Three times a day (TID) | ORAL | Status: DC
  Administered 2011-12-16 – 2011-12-18 (×7): 237 mL via ORAL

## 2011-12-16 NOTE — Progress Notes (Signed)
Pt observed in the hallway interacting with peers.  He reports he has been attending groups today.  He states he wants to go to long term from here, and is looking at [facility].  He says he has been using for a long time.  He states he wants to turn his life around and get off of drugs.  He has poor family support.  He is having minimal withdrawal symptoms at this time.  He denies SI/HI/AV.  He is appropriate with staff and peers.  Pt encouraged to make his needs known to staff.  Pt voices understanding.  Safety maintained with q15 minute checks.

## 2011-12-16 NOTE — Progress Notes (Signed)
Pt stated Boost Plus tasted "nasty" but stated he liked Ensure. Noted supplement already changed to Ensure which pt prefers.

## 2011-12-16 NOTE — Progress Notes (Signed)
Psychoeducational Group Note  Date:  12/16/2011 Time: 2000  Group Topic/Focus:  Wrap-Up Group:   The focus of this group is to help patients review their daily goal of treatment and discuss progress on daily workbooks.  Participation Level:  Active  Participation Quality:  Alert  Affect:    Cognitive:    Insight:    Engagement in Group:    Additional Comments:  Patient did attend AA meeting tonight.  Destanee Bedonie, Newton Pigg 12/16/2011, 8:31 PM

## 2011-12-16 NOTE — Progress Notes (Signed)
[facility] Group Notes:  (Counselor/Nursing/MHT/Case Management/Adjunct)  12/16/2011 3:01 PM  Type of Therapy:  Recovery Goals: The focus of this group is to identify appropriate goals for recovery and establish a plan to achieve them.  Participation Level:  Active  Participation Quality:  Appropriate  Affect:  Appropriate  Cognitive:  Appropriate  Insight:  Good  Engagement in Group:  Good  Engagement in Therapy:  Good  Modes of Intervention:  Education  Summary of Progress/Problems:Pt participated in recovery goals group. Pt defined what recovery is. Pt also was given a personal recovery goals worksheet. Pt worked on the worksheet and wrote what changes needed to be made and set a goal toward the change. Pt was asked to make specific, measurable goals that would be realistic to accomplish. Pt shared when asked questions during group. Pt. stated that he enjoyed the group and that he is ready to make a change in his life for the better.    Ardelle Park O 12/16/2011, 3:01 PM

## 2011-12-16 NOTE — Progress Notes (Signed)
[facility] Group Notes: (Counselor/Nursing/MHT/Case Management/Adjunct) 12/16/2011   @  11:00am  Finding Balance in Life  Type of Therapy:  Group Therapy  Participation Level:  Active  Participation Quality: Appropriate, Sharing, Supportive    Affect:  Appropriate  Cognitive:  Appropriate  Insight:  Good  Engagement in Group: Good  Engagement in Therapy:  Good  Modes of Intervention:  Support and Exploration  Summary of Progress/Problems: Nijel explored the concept of balance, stating that to him it means having meaning or purpose in life. He was able to identify many imbalanced areas in his life, concentrating mostly on his lack of coping skills. He stated that using substances was modeled for him by his parents and is the only thing he's ever done to cope, so it is easy for him to turn to. Caydyn went on to say that he has made some internal changes while in the hospital, and plans to balance that with external changes at discharge: seeking further treatment and a positive support group. He was supportive to another group member who explored setting goals and stated that he has to remember not to set his goals too high or he will set himself up for failure. Lennox Laity shared that he thinks of his small accomplishments as stepping stones, and that not only coming to the hospital but engaging in groups and being open with the doctor are stepping stones to recovery.  Billie Lade 12/16/2011   3:34 PM

## 2011-12-16 NOTE — Progress Notes (Signed)
Patient's self inventory sheet, patient has poor sleep, good appetite, normal energy level, poor attention span.  Rated depression #4, hopelessness #1.  Denied SI at this time.  Has felt pain and dizziness in past 24 hours.   Has talked to MD about dizziness and blurred vision.  Zero pain goal.   Worst pain #8.  "Anxiety still bad."   No discharge plans.   Needs financial assistance for medications. Patient stated he is not ready to be on his own.   Needs help to buy meds.  Would like to go to [facility] for drug treatment.   Stated he has talked to MD about dizziness/blurred vision.   Feels he needs to get adjusted to new medications.   At this time, patient denies SI and HI.   Contracts for safety.   Denied A/V hallucations.   Denied pain. "Actually pretty happy."   Stated he will ask for his dental gel at night after brushing teeth.   Feels he needs dental appointment.  Have discussed clothing situation with patient.

## 2011-12-16 NOTE — Progress Notes (Signed)
Psychoeducational Group Note  Date:  12/16/2011 Time:  1000  Group Topic/Focus:  Therapeutic Activity- "Apples to Apples"  Participation Level: Did Not Attend  Participation Quality:  Not Applicable  Affect:  Not Applicable  Cognitive:  Not Applicable  Insight:  Not Applicable  Engagement in Group: Not Applicable  Additional Comments:  Pt did not attend group but remained lying in bed.   Sharyn Lull 12/16/2011, 10:40 AM
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
## 2011-12-16 NOTE — Progress Notes (Signed)
Pt attended AA evening group on the 300 hall tonight.

## 2011-12-16 NOTE — Progress Notes (Signed)
[facility] MD Progress Note  12/16/2011 2:50 PM  S: "I feel like I am drunk. It started after I took the 50 mg Vistaril last night. The doctor has just increased it. I would want to go back to 25 mg bid. Mood is a lot better. I am able to fall asleep, but cannot maintain sleep".  Diagnosis:   Axis I: Bipolar affective disorder, depressed, severe degree, without mention of psychotic behavior,Methemphatamine dependence Axis II: Deferred Axis III:  Past Medical History  Diagnosis Date  . Arthritis    Axis IV: other psychosocial or environmental problems Axis V: 41-50 serious symptoms  ADL's:  Intact  Sleep: Good  Appetite:  Good  Suicidal Ideation: "No" Plan:  No Intent:  No Means:  No Homicidal Ideation: "No" Plan:  No Intent:  No Means:  No  AEB (as evidenced by): Per patient's reports  Mental Status Examination/Evaluation: Objective:  Appearance: Casual, thin  Eye Contact::  Good  Speech:  Clear and Coherent  Volume:  Normal  Mood:  Euthymic  Affect:  Appropriate  Thought Process:  Coherent  Orientation:  Full  Thought Content:  Rumination  Suicidal Thoughts:  No  Homicidal Thoughts:  No  Memory:  Immediate;   Good Recent;   Good Remote;   Good  Judgement:  Fair  Insight:  Good  Psychomotor Activity:  Normal  Concentration:  Good  Recall:  Good  Akathisia:  No  Handed:  Right  AIMS (if indicated):     Assets:  Desire for Improvement  Sleep:  Number of Hours: 6.75    Vital Signs:Blood pressure 124/85, pulse 70, temperature 98.1 F (36.7 C), temperature source Oral, resp. rate 20, height 6\' 1"  (1.854 m), weight 59.421 kg (131 lb). Current Medications: Current Facility-Administered Medications  Medication Dose Route Frequency Provider Last Rate Last Dose  . alum & mag hydroxide-simeth (MAALOX/MYLANTA) 200-200-20 MG/5ML suspension 30 mL  30 mL Oral Q4H PRN Sanjuana Kava, NP      . amitriptyline (ELAVIL) tablet 25 mg  25 mg Oral QHS Mike Craze, MD   25 mg at  12/15/11 2126  . benzocaine (ORAJEL) 10 % mucosal gel   Mouth/Throat QID PRN Mike Craze, MD      . carbamazepine (TEGRETOL) tablet 200 mg  200 mg Oral TID Mike Craze, MD   200 mg at 12/16/11 1409  . carbamazepine (TEGRETOL) tablet 200 mg  200 mg Oral NOW Mike Craze, MD   200 mg at 12/15/11 1957  . feeding supplement (ENSURE COMPLETE) liquid 237 mL  237 mL Oral TID WC Mike Craze, MD   237 mL at 12/16/11 1206  . GRX ANALGESIC BALM OINT 1 application  1 application Apply externally PRN Mike Craze, MD      . hydrOXYzine (ATARAX/VISTARIL) tablet 25 mg  25 mg Oral TID PRN Mike Craze, MD      . hydrOXYzine (ATARAX/VISTARIL) tablet 50 mg  50 mg Oral TID Mike Craze, MD   50 mg at 12/16/11 1408  . ibuprofen (ADVIL,MOTRIN) tablet 600 mg  600 mg Oral Q6H PRN Mike Craze, MD      . magnesium hydroxide (MILK OF MAGNESIA) suspension 30 mL  30 mL Oral Daily PRN Sanjuana Kava, NP      . meloxicam (MOBIC) tablet 7.5 mg  7.5 mg Oral Daily Mike Craze, MD   7.5 mg at 12/15/11 1808  . nicotine (NICODERM CQ - dosed in  mg/24 hours) patch 21 mg  21 mg Transdermal Q0600 Mike Craze, MD   21 mg at 12/16/11 0615  . pantoprazole (PROTONIX) EC tablet 20 mg  20 mg Oral BID AC Mike Craze, MD   20 mg at 12/16/11 0616  . DISCONTD: carbamazepine (TEGRETOL) tablet 200 mg  200 mg Oral TID Sanjuana Kava, NP   200 mg at 12/15/11 1718  . DISCONTD: hydrOXYzine (ATARAX/VISTARIL) tablet 50 mg  50 mg Oral TID PRN Mike Craze, MD      . DISCONTD: lactose free nutrition (BOOST PLUS) liquid 237 mL  237 mL Oral TID WC Lavena Bullion, RD   237 mL at 12/15/11 1718    Lab Results: No results found for this or any previous visit (from the past 48 hour(s)).  Physical Findings: AIMS: Facial and Oral Movements Muscles of Facial Expression: None, normal Lips and Perioral Area: None, normal Jaw: None, normal Tongue: None, normal,Extremity Movements Upper (arms, wrists, hands, fingers): None,  normal Lower (legs, knees, ankles, toes): None, normal, Trunk Movements Neck, shoulders, hips: None, normal, Overall Severity Severity of abnormal movements (highest score from questions above): None, normal Incapacitation due to abnormal movements: None, normal Patient's awareness of abnormal movements (rate only patient's report): No Awareness, Dental Status Current problems with teeth and/or dentures?: No Does patient usually wear dentures?: No  CIWA:  CIWA-Ar Total: 2  COWS:  COWS Total Score: 0   Treatment Plan Summary: Daily contact with patient to assess and evaluate symptoms and progress in treatment Medication management  Plan:  Decrease Vistaril from 50 mg tid to 25 mg tid. Continue current treatment plan.  Armandina Stammer I 12/16/2011, 2:50 PM

## 2011-12-17 MED ORDER — TRAZODONE 25 MG HALF TABLET
25.0000 mg | ORAL_TABLET | Freq: Once | ORAL | Status: AC
  Administered 2011-12-18: 25 mg via ORAL
  Filled 2011-12-17: qty 1

## 2011-12-17 NOTE — Progress Notes (Signed)
Psychoeducational Group Note  Date:  12/17/2011 Time:  2000  Group Topic/Focus:  Wrap-Up Group:   The focus of this group is to help patients review their daily goal of treatment and discuss progress on daily workbooks.  Participation Level:  Active  Participation Quality:  Appropriate  Affect:  Appropriate  Cognitive:  Alert  Insight:  Good  Engagement in Group:  Good  Additional Comments:  Pt stated that he is having some placement issues. Case manager is working with him on this. Pt stated that he needs more than two weeks in a treatment facility.  Kaleen Odea R 12/17/2011, 9:54 PM

## 2011-12-17 NOTE — Discharge Summary (Signed)
Physician Discharge Summary Note  Patient:  John Christian is an 26 y.o., male MRN:  [id number] DOB:  Jan 09, 1986 Patient phone:  [phone] (home)  Patient address:   [address] [place] [address],   Date of Admission:  12/13/2011 Date of Discharge: 12/18/11  Reason for Admission: Suicidal thoughts  Discharge Diagnoses: Principal Problem:  *Bipolar affective disorder, depressed, severe degree, without mention of psychotic behavior Active Problems:  Methamphetamine dependence   Axis Diagnosis:   AXIS I:  Methemphatamine dependence, Bipolar affective disorder, depressed, severe degree, without mention of psychotic behavior AXIS II:  Deferred AXIS III:   Past Medical History  Diagnosis Date  . Arthritis    AXIS IV:  economic problems, occupational problems, other psychosocial or environmental problems, problems related to legal system/crime and Polysubstance abuse and dependency. AXIS V:  65  Level of Care:  OP  Hospital Course:  This is a 26 year old Caucasian male, admitted to [facility] from the [facility] ED with reports of suicidal ideations and increased depression. Patient reports, "I went to the [facility] ED on the 12/12/11. I was having suicidal thoughts. I have been having suicidal thoughts off and on for a long time. I am now severely depressed x 2 weeks. I have a lot of stressors contributing to my depression and mood swings. My family dis-owned me because of my behaviors and drug use. I have a daughter that I am not allowed to see because of my behavior. I have been in and out of prison for burglary, drug possession and shop lifting. I just got out of prison on 07/20/11 of this year. I am currently homeless. I have mental illness. This has been evidence since my childhood. I was at the [facility] as young as 26 years old. I was not monitored, raised well and had any sense of direction growing up. I did not have any role models. I did what  I know how to do and that was, hang with the wrong crowd. I was at the [facility] in 2009 for my anger problems. I have been diagnosed with manic depression in the past. I have racing thoughts, mood swings and I don't sleep at night. I did not finish high school and I am unemployed. I attempted suicide 2 weeks ago by overdose on Xanax. My drug of choice of is Meth. Although, I have dabbled on any and other kinds of illegal drugs".  Upon admission into this [facility] unit, patient was started on Tegretol 200 mg tid for mood stabilization. He was also enrolled in group counseling services to assist him in learning the coping skills that will assist him to cope and manage his symptoms better after discharge. He did take his medications as ordered and participated well in group counseling sessions. He was also ordered to attend the AA/NA meetings being offered in on this unit. Patient did participate as ordered.  Mr. Divelbiss also received medication management and monitoring for his other medical issues and complaints. He tolerated his treatment regimen without any significant adverse effects and or reactions reported and or noted by staff. As his treatment regimen progressed, patient started to report improved mood and reduction of symptoms. This is also evidenced in patient's presentation of good affects, good eye contact and his interactions with staff and other patients.  Patient attended treatment team meeting this am and met with his treatment team members. His reason for admission, treatment plan and his response to treatment  discussed. Patient endorsed that his doing well, stabilized and ready to be discharged to pursue the next phase of his substance abuse treatment. It was agreed upon between patient and the treatment team that he will be discharged to [facility]. He will be picked up by [facility] sometime this afternoon. Upon discharge, patient denies any suicidal, homicidal ideations, auditory, visual  hallucinations, delusional thinking and or withdrawal symptoms. He received 2 weeks worth samples of his discharge medications. He left [facility] with all personal belongings in no apparent distress.   Consults:  None  Significant Diagnostic Studies:  labs: CMP, CBC with diff, Toxicology  Discharge Vitals:   Blood pressure 128/90, pulse 90, temperature 97.9 F (36.6 C), temperature source Oral, resp. rate 17, height 6\' 1"  (1.854 m), weight 59.421 kg (131 lb).  Mental Status Exam: See Mental Status Examination and Suicide Risk Assessment completed by Attending Physician prior to discharge.  Discharge destination:  Home  Is patient on multiple antipsychotic therapies at discharge:  No   Has Patient had three or more failed trials of antipsychotic monotherapy by history:  No  Recommended Plan for Multiple Antipsychotic Therapies: NA  Tegretol 200 mg bid; for mood stabilization.  Elavil 25 mg q bedtime for sleep.     Follow-up recommendations:  Activity:  as tolerated Other:  Keep all scheduled follow-up appointments as recommended.   Comments:  Take all your medications as prescribed by your mental healthcare provider. Report any adverse effects and or reactions from your medicines to your outpatient provider promptly. Patient is instructed and cautioned to not engage in alcohol and or illegal drug use while on prescription medicines. In the event of worsening symptoms, patient is instructed to call the crisis hotline, 911 and or go to the nearest ED for appropriate evaluation and treatment of symptoms. Follow-up with your primary care provider for your other medical issues, concerns and or health care needs.     SignedArmandina Stammer I 12/17/2011, 9:17 AM

## 2011-12-17 NOTE — Progress Notes (Signed)
[facility] Group Notes:  (Counselor/Nursing/MHT/Case Management/Adjunct) 12/17/2011  1:15pm-2:30pm Emotion Regulation   Type of Therapy:  Group Therapy  Participation Level:  Did Not Attend - Keshon was initially in the group room, but left at he beginning of group stating that he did not feel well     Angus Palms, LCSW 12/17/2011  2:42 PM

## 2011-12-17 NOTE — Progress Notes (Signed)
Psychoeducational Group Note  Date:  12/17/2011 Time:  1100     Group Topic/Focus:  Goals Group:   The focus of this group is to help patients establish daily goals to achieve during treatment and discuss how the patient can incorporate goal setting into their daily lives to aide in recovery.  Participation Level:  Active  Participation Quality:  Appropriate  Affect:  Appropriate  Cognitive:  Appropriate  Insight:  Good  Engagement in Group:  Good  Additional Comments:  Pt stated he realized he has a "Big Ego" and he realizes he has to let it go. Pt stated "I can be mean at times"; Pt is thought it was good to apply values and goals to help him cope with is anger.   Lenox Ahr 12/17/2011, 1:35 PM

## 2011-12-17 NOTE — Progress Notes (Signed)
Medical/psychiatric examination/treatment/procedure(s) were performed by non-physician practitioner and as supervising physician I was immediately available for consultation/collaboration. I agree with this note.  

## 2011-12-17 NOTE — Progress Notes (Signed)
12/17/2011         Time: 1500      Group Topic/Focus: The focus of this group is on enhancing the patient's understanding of leisure, barriers to leisure, and the importance of engaging in positive leisure activities upon discharge for improved total health.  Participation Level: Active  Participation Quality: Resistant and Sharing  Affect: Labile  Cognitive: Oriented   Additional Comments: Patient resistant at first, asking questions like, "what am I supposed to learn from this?" Patient settled throughout group, talked about focusing on building a support network to help him stay sober upon discharge.   Lennette Fader 12/17/2011 3:36 PM

## 2011-12-17 NOTE — Progress Notes (Signed)
Pt attended discharge planning group and actively participated in group.  SW provided pt with today's workbook.  Pt presents with calm mood and affect.  Pt denies having depression, SI/HI.  Pt rates anxiety at a 7.  Pt continues to want further substance abuse treatment.  SW referred pt to [facility] today.  [facility] is reviewing his information.  SW will continue to monitor this referral.  No further needs voiced by pt at this time.    Reyes Ivan, LCSWA 12/17/2011  1:50 PM

## 2011-12-17 NOTE — Progress Notes (Signed)
D: Patient denies SI/HI. The patient has an anxious mood and affect. The patient rates his depression and hopelessness both a 1 out of 10 (1 low/10 high). The patient reports sleeping fairly well and states that his appetite and energy level are normal. The patient is attending groups and participating on the unit. The patient did state that his anxiety is still "pretty high."  A: Patient given emotional support from RN. Patient encouraged to come to staff with concerns and/or questions. Patient's medication routine continued. Patient's orders and plan of care reviewed. Patient referred to MD for anxiety and questions about medications.  R: Patient remains appropriate and cooperative. Will continue to monitor patient q15 minutes for safety.

## 2011-12-17 NOTE — Progress Notes (Signed)
[facility] MD Progress Note  12/17/2011 2:52 PM  S: "I'm doing pretty good. Waiting to get out of here as soon a bed is available at the treatment Center. I'm ready to face my problems. I'm looking forward to it, just a little anxious. My mood is good".  Diagnosis:   Axis I: Bipolar affective disorder, depressed, severe degree, without mention of psychotic behavior,  Methamphetamine dependence Axis II: Deferred Axis III:  Past Medical History  Diagnosis Date  . Arthritis    Axis IV: economic problems, educational problems, housing problems, occupational problems and other psychosocial or environmental problems Axis V: 51-60 moderate symptoms  ADL's:  Intact  Sleep: Good  Appetite:  Good  Suicidal Ideation: "No" Plan:  No Intent:  No Means:  no Homicidal Ideation: "No" Plan:  No Intent:  no Means:  no  AEB (as evidenced by): Per patient's reports.  Mental Status Examination/Evaluation: Objective:  Appearance: Casual, thin  Eye Contact::  Good  Speech:  Clear and Coherent  Volume:  Normal  Mood:  Euthymic  Affect:  Appropriate  Thought Process:  Coherent and Intact  Orientation:  Full  Thought Content:  Rumination  Suicidal Thoughts:  No  Homicidal Thoughts:  No  Memory:  Immediate;   Good Recent;   Good Remote;   Good  Judgement:  Good  Insight:  Good  Psychomotor Activity:  Normal  Concentration:  Good  Recall:  Good  Akathisia:  No  Handed:  Right  AIMS (if indicated):     Assets:  Desire for Improvement  Sleep:  Number of Hours: 6.75    Vital Signs:Blood pressure 128/90, pulse 90, temperature 97.9 F (36.6 C), temperature source Oral, resp. rate 17, height 6\' 1"  (1.854 m), weight 59.421 kg (131 lb). Current Medications: Current Facility-Administered Medications  Medication Dose Route Frequency Provider Last Rate Last Dose  . alum & mag hydroxide-simeth (MAALOX/MYLANTA) 200-200-20 MG/5ML suspension 30 mL  30 mL Oral Q4H PRN Sanjuana Kava, NP      .  amitriptyline (ELAVIL) tablet 25 mg  25 mg Oral QHS Mike Craze, MD   25 mg at 12/16/11 2133  . benzocaine (ORAJEL) 10 % mucosal gel   Mouth/Throat QID PRN Mike Craze, MD      . carbamazepine (TEGRETOL) tablet 200 mg  200 mg Oral TID Mike Craze, MD   200 mg at 12/17/11 1432  . feeding supplement (ENSURE COMPLETE) liquid 237 mL  237 mL Oral TID WC Lavena Bullion, RD   237 mL at 12/17/11 1149  . GRX ANALGESIC BALM OINT 1 application  1 application Apply externally PRN Mike Craze, MD      . hydrOXYzine (ATARAX/VISTARIL) tablet 25 mg  25 mg Oral TID PRN Mike Craze, MD      . hydrOXYzine (ATARAX/VISTARIL) tablet 25 mg  25 mg Oral TID Sanjuana Kava, NP   25 mg at 12/17/11 1432  . ibuprofen (ADVIL,MOTRIN) tablet 600 mg  600 mg Oral Q6H PRN Mike Craze, MD   600 mg at 12/16/11 2135  . magnesium hydroxide (MILK OF MAGNESIA) suspension 30 mL  30 mL Oral Daily PRN Sanjuana Kava, NP      . meloxicam (MOBIC) tablet 7.5 mg  7.5 mg Oral Daily Mike Craze, MD   7.5 mg at 12/15/11 1808  . nicotine (NICODERM CQ - dosed in mg/24 hours) patch 21 mg  21 mg Transdermal Q0600 Mike Craze, MD  21 mg at 12/17/11 0607  . pantoprazole (PROTONIX) EC tablet 20 mg  20 mg Oral BID AC Mike Craze, MD   20 mg at 12/17/11 0865    Lab Results: No results found for this or any previous visit (from the past 48 hour(s)).  Physical Findings: AIMS: Facial and Oral Movements Muscles of Facial Expression: None, normal Lips and Perioral Area: None, normal Jaw: None, normal Tongue: None, normal,Extremity Movements Upper (arms, wrists, hands, fingers): None, normal Lower (legs, knees, ankles, toes): None, normal, Trunk Movements Neck, shoulders, hips: None, normal, Overall Severity Severity of abnormal movements (highest score from questions above): None, normal Incapacitation due to abnormal movements: None, normal Patient's awareness of abnormal movements (rate only patient's report): No Awareness,  Dental Status Current problems with teeth and/or dentures?: No Does patient usually wear dentures?: No  CIWA:  CIWA-Ar Total: 0  COWS:  COWS Total Score: 0   Treatment Plan Summary: Daily contact with patient to assess and evaluate symptoms and progress in treatment Medication management  Plan: Currently awaiting placement at [facility] treatment Center. Continue current treatment plan.  Armandina Stammer I 12/17/2011, 2:52 PM

## 2011-12-17 NOTE — Progress Notes (Signed)
[facility] Group Notes:  (Counselor/Nursing/MHT/Case Management/Adjunct)  12/17/2011 11:36 AM  Type of Therapy:  Psychoeducational Skills  Participation Level:  Active  Participation Quality:  Inattentive, Redirectable and Resistant  Affect:  Appropriate  Cognitive:  Alert, Appropriate and Oriented  Insight:  Limited  Engagement in Group:  Limited  Engagement in Therapy:  n/a  Modes of Intervention:  Activity, Clarification, Education, Limit-setting, Problem-solving, Socialization and Support  Summary of Progress/Problems: John Christian attended Psychoeducational group that focused on using quality time with support systems/individuals to engage in healthy coping skills. John Christian participated in activity guessing about self and peers. John Christian was active but challenging as he stated that for him appropriate support people are drug users and appropriate activities were to hang out at the bar while group discussed who their supports are, how they can spend positive quality time with them as a coping skill and a way to strengthen their relationship. John Christian was redirected and asked not to think about what he used to do but what he would like to do in order to have a safe and healthy future after disctahge. John Christian was given a homework assignment to find two ways to improve his support systems and 20 activities he can do to spend quality time with supports.    John Christian 12/17/2011, 11:36 AM

## 2011-12-18 DIAGNOSIS — F3113 Bipolar disorder, current episode manic without psychotic features, severe: Principal | ICD-10-CM

## 2011-12-18 DIAGNOSIS — F1520 Other stimulant dependence, uncomplicated: Secondary | ICD-10-CM

## 2011-12-18 MED ORDER — BENZOCAINE 10 % MT GEL: Freq: Four times a day (QID) | OROMUCOSAL | Status: DC | PRN

## 2011-12-18 MED ORDER — AMITRIPTYLINE HCL 25 MG PO TABS: 25.0000 mg | ORAL_TABLET | Freq: Every day | ORAL | Status: DC

## 2011-12-18 MED ORDER — NICOTINE 21 MG/24HR TD PT24: 1.0000 | MEDICATED_PATCH | Freq: Every day | TRANSDERMAL | Status: AC

## 2011-12-18 MED ORDER — MELOXICAM 7.5 MG PO TABS: 7.5000 mg | ORAL_TABLET | Freq: Every day | ORAL | Status: DC

## 2011-12-18 MED ORDER — PANTOPRAZOLE SODIUM 20 MG PO TBEC: 20.0000 mg | DELAYED_RELEASE_TABLET | Freq: Two times a day (BID) | ORAL | Status: DC

## 2011-12-18 MED ORDER — CARBAMAZEPINE 200 MG PO TABS: 200.0000 mg | ORAL_TABLET | Freq: Three times a day (TID) | ORAL | Status: DC

## 2011-12-18 NOTE — Progress Notes (Signed)
[facility] Case Management Discharge Plan:  Will you be returning to the same living situation after discharge: No. Pt is homeless and will seek placement after treatment at [facility] At discharge, do you have transportation home?:Yes,  [facility] will pick pt up Do you have the ability to pay for your medications:Yes,  provided samples  Release of information consent forms completed and in the chart;  Patient's signature needed at discharge.  Patient to Follow up at:  Follow-up Information    Follow up with [facility] on 12/18/2011. ([facility] will pick up today at 1:00 pm)    Contact information:   [address] [place], [place] [address] [phone]         Patient denies SI/HI:   Yes,  denies SI/HI today    Safety Planning and Suicide Prevention discussed:  Yes,  discussed with pt today  Barrier to discharge identified:No.  Summary and Recommendations: Pt attended discharge planning group and actively participated in group.  SW provided pt with today's workbook.  Pt presents with calm mood and affect.  Pt denies having depression, SI/HI and reports high anxiety.  Pt reports feeling stable to d/c.  No recommendations from SW.  No further needs voiced by pt.  Pt stable to discharge.     Carmina Miller 12/18/2011, 9:23 AM
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
## 2011-12-18 NOTE — Tx Team (Signed)
Interdisciplinary Treatment Plan Update (Adult)  Date:  12/18/2011  Time Reviewed:  10:37 AM   Progress in Treatment: Attending groups: Yes Participating in groups:  Yes Taking medication as prescribed: Yes Tolerating medication:  Yes Family/Significant other contact made: Yes Patient understands diagnosis:  Yes Discussing patient identified problems/goals with staff:  Yes Medical problems stabilized or resolved:  Yes Denies suicidal/homicidal ideation: Yes Issues/concerns per patient self-inventory:  None identified Other: N/A  New problem(s) identified: None Identified  Reason for Continuation of Hospitalization: Stable to d/c  Interventions implemented related to continuation of hospitalization: Stable to d/c  Additional comments: N/A  Estimated length of stay: D/C today  Discharge Plan: Pt will follow up with [facility] for further treatment.    New goal(s): N/A  Review of initial/current patient goals per problem list:    1.  Goal(s): Reduce depressive and anxiety symptoms  Met:  Yes  Target date: by discharge  As evidenced by: Reducing depression from a 10 to a 3 as reported by pt.  Pt rates depression at a 0 today and high anxiety but feels stable to d/c.   2.  Goal (s): Reduce/Eliminate suicidal ideation  Met:  Yes  Target date: by discharge  As evidenced by: pt reporting no SI.    3.  Goal(s): Address substance abuse  Met:  Yes  Target date: by discharge  As evidenced by: Referred to [facility] for further treatment.    Attendees: Patient:  John Christian 12/18/2011 10:37 AM   Family:     Physician:  Orson Aloe, MD 12/18/2011 10:37 AM   Nursing:   Quintella Reichert, RN 12/18/2011 10:37 AM   Case Manager:  Reyes Ivan, LCSWA 12/18/2011 10:37 AM   Counselor:  Angus Palms, LCSW 12/18/2011 10:37 AM   Other:  Juline Patch, LCSW 12/18/2011 10:37 AM   Other: Lawson Fiscal, RN 12/18/2011 10:37 AM   Other:  Boykin Nearing, MSW intern 12/18/2011 10:37 AM   Other:       Scribe for Treatment Team:   Carmina Miller, 12/18/2011 10:37 AM

## 2011-12-18 NOTE — Progress Notes (Signed)
Pt attended group on the 500 hall tonight.

## 2011-12-18 NOTE — Progress Notes (Signed)
[facility] Adult Inpatient Family/Significant Other Suicide Prevention Education  Suicide Prevention Education:  Contact Attempts: Nyoka Cowden, [phone]  has been identified by the patient as the family member/significant other with whom the patient will be residing, and identified as the person(s) who will aid the patient in the event of a mental health crisis.  With written consent from the patient, two attempts were made to provide suicide prevention education, prior to and/or following the patient's discharge.  We were unsuccessful in providing suicide prevention education.  A suicide education pamphlet was given to the patient to share with family/significant other.  Date and time of first attempt: COUNSELOR MADE SEVERAL ATTEMPTS, BUT THE NUMBER IS NO LONGER VALID, AND John Christian DID NOT KNOW ANOTHER CONTACT NUMBER. Suicide prevention information was provided to and discussed with John Christian, who stated that he plans to go to his meetings and if he is in crisis he will return to [facility]. He was discharged to further treatment at [facility].   Billie Lade 12/18/2011, 2:33 PM

## 2011-12-18 NOTE — Progress Notes (Signed)
Pt came to the nurse's station reporting that he woke up and could not go back to sleep.  He said this has been happening a lot since he has been here.  Pt did not take his Vistaril earlier, stating it makes him feel strange, but not sleepy.  Spoke with Karleen Hampshire, PA who ordered Trazodone 25mg  x 1 for tonight.  Pt encouraged to speak with the MD in the AM.  Continue to monitor per unit protocol.
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
## 2011-12-18 NOTE — Progress Notes (Signed)
Pt observed on the unit interacting with other patients.  Pt reports he wants to go to [facility] for further treatment for his substance abuse.  He states he wants to get off drugs but needs long term help.  He reports minimal withdrawal symptoms, but feels anxious about his situation and uncertainty about where he may go if [facility] doesn't work out.  Pt voiced no other needs/concerns.  Support/encouragement given.  Pt denies SI/HI/AV at this time.  Pt makes his needs known to staff.  Safety maintained with q15 minute checks.

## 2011-12-18 NOTE — Progress Notes (Signed)
Patient ID: Nylan Nevel, male   DOB: 1986/02/08, 26 y.o.   MRN: [id number] D: Pt. Denies lethality and A/V/H's or other problems beyond his chronic anxiety, which he says, "i've had all my life."  Pt. Rated his depression on self-inventory as "1"/10 and helpless/hopelessness at "1"/10.  Pt. States he wants to be discharged under the care of [facility] and resolves to stay clean and sober and to stay away from any environment which supports illicit drugs or ETOH use.  Pt. Talked about he current charges for DWI and knows he has an impending court date of Oct. 31, 2013. Pt. States his meds have helped him and he resolves to continue to stay on his medications after discharge.  A: Pt. was seen in Treatment Team this AM and was informed by DR. Walker that he could be discharged; Pt. was happy to receive this news. Pt. remains stable emotionally, except for his on-going anxiety. Pt. repeated his resolves as above. Pt. given discharge instructions and medications and prescriptions to take with him and also was given some clothing: an insulated jacket, a pair of black athletic shoes with shoestrings, 2 pair of underpants, 2 dress shirts, hospital sox.  Pt. Also retrieved his belt and afew other belongings from the locker. Pt. states he understands his discharge instructions and put his packet of meds, instructions into one of his bags to take with him. R: Pt. Is stable and ready for discharge: Pt. accompanied to his awaiting ride with a rep. from [facility].

## 2011-12-19 NOTE — Progress Notes (Signed)
Patient Discharge Instructions:  After Visit Summary (AVS):   Faxed to:  12/19/2011 Psychiatric Admission Assessment Note:   Faxed to:  12/19/2011 Suicide Risk Assessment - Discharge Assessment:   Faxed to:  12/19/2011 Faxed/Sent to the Next Level Care provider:  12/19/2011  Faxed to [facility] @ [phone]  Wandra Scot, 12/19/2011, 3:32 PM

## 2011-12-21 NOTE — Discharge Summary (Signed)
I agree with this D/C Summary.  

## 2011-12-24 NOTE — Progress Notes (Signed)
Read and reviewed. 

## 2012-09-22 ENCOUNTER — Emergency Department (HOSPITAL_COMMUNITY)
Admission: EM | Admit: 2012-09-22 | Discharge: 2012-09-23 | Disposition: A | Discharge status: Other Acute Inpatient Hospital | Payer: Self-pay | Attending: Emergency Medicine | Admitting: Emergency Medicine

## 2012-09-22 ENCOUNTER — Encounter (HOSPITAL_COMMUNITY): Payer: Self-pay | Admitting: Emergency Medicine

## 2012-09-22 DIAGNOSIS — Z8659 Personal history of other mental and behavioral disorders: Secondary | ICD-10-CM | POA: Insufficient documentation

## 2012-09-22 DIAGNOSIS — F329 Major depressive disorder, single episode, unspecified: Secondary | ICD-10-CM

## 2012-09-22 DIAGNOSIS — Z880 Allergy status to penicillin: Secondary | ICD-10-CM | POA: Insufficient documentation

## 2012-09-22 DIAGNOSIS — F17200 Nicotine dependence, unspecified, uncomplicated: Secondary | ICD-10-CM | POA: Insufficient documentation

## 2012-09-22 DIAGNOSIS — F32A Depression, unspecified: Secondary | ICD-10-CM

## 2012-09-22 DIAGNOSIS — F3289 Other specified depressive episodes: Secondary | ICD-10-CM | POA: Insufficient documentation

## 2012-09-22 DIAGNOSIS — Z8669 Personal history of other diseases of the nervous system and sense organs: Secondary | ICD-10-CM | POA: Insufficient documentation

## 2012-09-22 DIAGNOSIS — R45851 Suicidal ideations: Secondary | ICD-10-CM | POA: Insufficient documentation

## 2012-09-22 DIAGNOSIS — M129 Arthropathy, unspecified: Secondary | ICD-10-CM | POA: Insufficient documentation

## 2012-09-22 HISTORY — DX: Polyneuropathy, unspecified: G62.9

## 2012-09-22 HISTORY — DX: Anxiety disorder, unspecified: F41.9

## 2012-09-22 LAB — CBC WITH DIFFERENTIAL/PLATELET
Basophils Absolute: 0.1 10*3/uL (ref 0.0–0.1)
Basophils Relative: 0 % (ref 0–1)
Eosinophils Absolute: 0.2 10*3/uL (ref 0.0–0.7)
Eosinophils Relative: 1 % (ref 0–5)
HCT: 38.6 % — ABNORMAL LOW (ref 39.0–52.0)
Hemoglobin: 13.2 g/dL (ref 13.0–17.0)
Lymphocytes Relative: 18 % (ref 12–46)
Lymphs Abs: 2.7 10*3/uL (ref 0.7–4.0)
MCH: 30.1 pg (ref 26.0–34.0)
MCHC: 34.2 g/dL (ref 30.0–36.0)
MCV: 88.1 fL (ref 78.0–100.0)
Monocytes Absolute: 1.1 10*3/uL — ABNORMAL HIGH (ref 0.1–1.0)
Monocytes Relative: 7 % (ref 3–12)
Neutro Abs: 11.1 10*3/uL — ABNORMAL HIGH (ref 1.7–7.7)
Neutrophils Relative %: 73 % (ref 43–77)
Platelets: 230 10*3/uL (ref 150–400)
RBC: 4.38 MIL/uL (ref 4.22–5.81)
RDW: 13.7 % (ref 11.5–15.5)
WBC: 15.2 10*3/uL — ABNORMAL HIGH (ref 4.0–10.5)

## 2012-09-22 MED ORDER — LORAZEPAM 1 MG PO TABS
1.0000 mg | ORAL_TABLET | Freq: Once | ORAL | Status: AC
  Administered 2012-09-23: 1 mg via ORAL
  Filled 2012-09-22: qty 1

## 2012-09-22 MED ORDER — LORAZEPAM 1 MG PO TABS: 1.0000 mg | ORAL_TABLET | Freq: Three times a day (TID) | ORAL | Status: DC | PRN

## 2012-09-22 MED ORDER — ACETAMINOPHEN 325 MG PO TABS: 650.0000 mg | ORAL_TABLET | ORAL | Status: DC | PRN

## 2012-09-22 MED ORDER — ONDANSETRON HCL 4 MG PO TABS: 4.0000 mg | ORAL_TABLET | Freq: Three times a day (TID) | ORAL | Status: DC | PRN

## 2012-09-22 MED ORDER — ALUM & MAG HYDROXIDE-SIMETH 200-200-20 MG/5ML PO SUSP: 30.0000 mL | ORAL | Status: DC | PRN

## 2012-09-22 MED ORDER — ZOLPIDEM TARTRATE 5 MG PO TABS: 5.0000 mg | ORAL_TABLET | Freq: Every evening | ORAL | Status: DC | PRN

## 2012-09-22 MED ORDER — IBUPROFEN 600 MG PO TABS
600.0000 mg | ORAL_TABLET | Freq: Three times a day (TID) | ORAL | Status: DC | PRN
  Administered 2012-09-23: 600 mg via ORAL
  Filled 2012-09-22: qty 1

## 2012-09-22 NOTE — ED Notes (Signed)
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

## 2012-09-22 NOTE — ED Provider Notes (Signed)
History    27 year old male with increasing depression and anxiety. Suicidal thoughts without a clear plan. I patient states multiple stressors including recent job loss, very poor health of his mother, losing his housing and a poor relationship with the mother of his daughter. He states that she is keeping him from seeing his daughter. Has history of heroin abuse but states that he has done so for the past year. No homicidal ideations. No hallucinations. Patient is living in [place]. Was trying to walk to [place] to stay at place of ex-fiance when he decided to come to ED.  CSN: [id number]  Arrival date & time 09/22/12  2253   First MD Initiated Contact with Patient 09/22/12 2333      Chief Complaint  Patient presents with  . Medical Clearance    (Consider location/radiation/quality/duration/timing/severity/associated sxs/prior treatment) HPI  Past Medical History  Diagnosis Date  . Arthritis   . Anxiety   . Neuropathy     Past Surgical History  Procedure Laterality Date  . Tonsillectomy      Family History  Problem Relation Age of Onset  . Hypertension Other   . Cancer Other     History  Substance Use Topics  . Smoking status: Current Every Day Smoker -- 2.00 packs/day    Types: Cigarettes  . Smokeless tobacco: Never Used  . Alcohol Use: No      Review of Systems  All systems reviewed and negative, other than as noted in HPI.   Allergies  Penicillins  Home Medications   Current Outpatient Rx  Name  Route  Sig  Dispense  Refill  . acetaminophen (TYLENOL) 325 MG tablet   Oral   Take 650 mg by mouth every 6 (six) hours as needed. pain         . amitriptyline (ELAVIL) 25 MG tablet   Oral   Take 1 tablet (25 mg total) by mouth at bedtime. For pain management, depression, and insomnia.   30 tablet   0   . benzocaine (ORAJEL) 10 % mucosal gel   Mouth/Throat   Use as directed in the mouth or throat 4 (four) times daily as needed for pain. For  management of tooth pain   5.3 g   0   . carbamazepine (TEGRETOL) 200 MG tablet   Oral   Take 1 tablet (200 mg total) by mouth 3 (three) times daily. For mood control.   90 tablet   0   . meloxicam (MOBIC) 7.5 MG tablet   Oral   Take 1 tablet (7.5 mg total) by mouth daily. For inflammation   30 tablet   0   . pantoprazole (PROTONIX) 20 MG tablet   Oral   Take 1 tablet (20 mg total) by mouth 2 (two) times daily before a meal. For control of stomach acid secretion and helps GERD. Mobic causes a problem with acid and this blocks that problem.   60 tablet   0     BP 138/95  Pulse 109  Temp(Src) 98.5 F (36.9 C) (Oral)  Resp 20  SpO2 94%  Physical Exam  Nursing note and vitals reviewed. Constitutional: He is oriented to person, place, and time. He appears well-developed and well-nourished. No distress.  HENT:  Head: Normocephalic and atraumatic.  Eyes: Conjunctivae are normal. Right eye exhibits no discharge. Left eye exhibits no discharge.  Neck: Neck supple.  Cardiovascular: Normal rate, regular rhythm and normal heart sounds.  Exam reveals no gallop and no friction rub.  No murmur heard. Pulmonary/Chest: Effort normal and breath sounds normal. No respiratory distress.  Abdominal: Soft. He exhibits no distension. There is no tenderness.  Musculoskeletal: He exhibits no edema and no tenderness.  Neurological: He is alert and oriented to person, place, and time. No cranial nerve deficit. He exhibits normal muscle tone. Coordination normal.  Skin: Skin is warm and dry.  Psychiatric: His behavior is normal. Thought content normal.    ED Course  Procedures (including critical care time)  Labs Reviewed  CBC WITH DIFFERENTIAL - Abnormal; Notable for the following:    WBC 15.2 (*)    HCT 38.6 (*)    Neutro Abs 11.1 (*)    Monocytes Absolute 1.1 (*)    All other components within normal limits  BASIC METABOLIC PANEL  URINE RAPID DRUG SCREEN (HOSP PERFORMED)  ETHANOL    No results found.   1. Depression   2. Suicidal ideation       MDM   27 year old male with increasing depression and suicidal thoughts in the setting of multiple increased stressors. Will obtain psychiatric consultation.         Raeford Razor, MD 09/22/12 2351

## 2012-09-23 ENCOUNTER — Inpatient Hospital Stay (HOSPITAL_COMMUNITY)
Admission: AD | Admit: 2012-09-23 | Discharge: 2012-09-26 | DRG: 897 | Disposition: A | Discharge status: Home / Self Care | Payer: Self-pay | Source: Intra-hospital | Attending: Psychiatry | Admitting: Psychiatry

## 2012-09-23 ENCOUNTER — Encounter (HOSPITAL_COMMUNITY): Payer: Self-pay | Admitting: Registered Nurse

## 2012-09-23 ENCOUNTER — Encounter (HOSPITAL_COMMUNITY): Payer: Self-pay | Admitting: *Deleted

## 2012-09-23 DIAGNOSIS — Z79899 Other long term (current) drug therapy: Secondary | ICD-10-CM

## 2012-09-23 DIAGNOSIS — R45851 Suicidal ideations: Secondary | ICD-10-CM

## 2012-09-23 DIAGNOSIS — F1910 Other psychoactive substance abuse, uncomplicated: Principal | ICD-10-CM | POA: Diagnosis present

## 2012-09-23 DIAGNOSIS — F411 Generalized anxiety disorder: Secondary | ICD-10-CM | POA: Diagnosis present

## 2012-09-23 DIAGNOSIS — F602 Antisocial personality disorder: Secondary | ICD-10-CM | POA: Diagnosis present

## 2012-09-23 DIAGNOSIS — F1520 Other stimulant dependence, uncomplicated: Secondary | ICD-10-CM

## 2012-09-23 DIAGNOSIS — F314 Bipolar disorder, current episode depressed, severe, without psychotic features: Secondary | ICD-10-CM

## 2012-09-23 LAB — RAPID URINE DRUG SCREEN, HOSP PERFORMED
Amphetamines: NOT DETECTED
Barbiturates: NOT DETECTED
Benzodiazepines: NOT DETECTED
Cocaine: NOT DETECTED
Opiates: NOT DETECTED
Tetrahydrocannabinol: NOT DETECTED

## 2012-09-23 LAB — BASIC METABOLIC PANEL
BUN: 18 mg/dL (ref 6–23)
CO2: 28 mEq/L (ref 19–32)
Calcium: 9.7 mg/dL (ref 8.4–10.5)
Chloride: 99 mEq/L (ref 96–112)
Creatinine, Ser: 0.98 mg/dL (ref 0.50–1.35)
GFR calc Af Amer: >90 mL/min (ref >90)
GFR calc non Af Amer: >90 mL/min (ref >90)
Glucose, Bld: 83 mg/dL (ref 70–99)
Potassium: 3.7 mEq/L (ref 3.5–5.1)
Sodium: 139 mEq/L (ref 135–145)

## 2012-09-23 LAB — ETHANOL: Alcohol, Ethyl (B): <11 mg/dL (ref 0–11)

## 2012-09-23 MED ORDER — TRAZODONE HCL 50 MG PO TABS
50.0000 mg | ORAL_TABLET | Freq: Every day | ORAL | Status: DC
  Filled 2012-09-23 (×5): qty 1

## 2012-09-23 MED ORDER — ACETAMINOPHEN 325 MG PO TABS
650.0000 mg | ORAL_TABLET | Freq: Four times a day (QID) | ORAL | Status: DC | PRN
  Administered 2012-09-23: 650 mg via ORAL

## 2012-09-23 MED ORDER — FLUOXETINE HCL 20 MG PO CAPS
20.0000 mg | ORAL_CAPSULE | Freq: Every day | ORAL | Status: DC
  Administered 2012-09-23: 20 mg via ORAL
  Filled 2012-09-23 (×2): qty 1

## 2012-09-23 MED ORDER — TRAMADOL HCL 50 MG PO TABS
50.0000 mg | ORAL_TABLET | Freq: Four times a day (QID) | ORAL | Status: DC | PRN
  Administered 2012-09-24 – 2012-09-26 (×7): 50 mg via ORAL
  Filled 2012-09-23 (×7): qty 1

## 2012-09-23 MED ORDER — ALUM & MAG HYDROXIDE-SIMETH 200-200-20 MG/5ML PO SUSP: 30.0000 mL | ORAL | Status: DC | PRN

## 2012-09-23 MED ORDER — MAGNESIUM HYDROXIDE 400 MG/5ML PO SUSP: 30.0000 mL | Freq: Every day | ORAL | Status: DC | PRN

## 2012-09-23 MED ORDER — OLANZAPINE 5 MG PO TABS: 5.0000 mg | ORAL_TABLET | Freq: Every day | ORAL | Status: DC

## 2012-09-23 MED ORDER — FLUOXETINE HCL 20 MG PO CAPS
20.0000 mg | ORAL_CAPSULE | Freq: Every day | ORAL | Status: DC
  Administered 2012-09-24 – 2012-09-26 (×3): 20 mg via ORAL
  Filled 2012-09-23 (×6): qty 1

## 2012-09-23 MED ORDER — OLANZAPINE 5 MG PO TABS
5.0000 mg | ORAL_TABLET | Freq: Every day | ORAL | Status: DC
  Administered 2012-09-23 – 2012-09-25 (×3): 5 mg via ORAL
  Filled 2012-09-23 (×6): qty 1

## 2012-09-23 MED ORDER — CHLORDIAZEPOXIDE HCL 25 MG PO CAPS
25.0000 mg | ORAL_CAPSULE | Freq: Four times a day (QID) | ORAL | Status: DC | PRN
  Administered 2012-09-23 (×2): 25 mg via ORAL
  Filled 2012-09-23 (×2): qty 1

## 2012-09-23 NOTE — [facility] Counselor (Signed)
Patient accepted to room 502-1 by Alvy Beal, NP/Dr. Kathryne Sharper. Support paperwork completed and faxed to [facility]. EDP-Dr. Patria Mane made aware of patient's disposition. Patient's nurse-Janie made aware of patient's disposition. The nursing report # is [phone]. Patient is here voluntarily and will be transferred to [facility] via hospital security.

## 2012-09-23 NOTE — ED Notes (Signed)
ACT into see 

## 2012-09-23 NOTE — ED Notes (Signed)
Up to the bathroom 
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
## 2012-09-23 NOTE — ED Notes (Signed)
Dr Allen into see 

## 2012-09-23 NOTE — BH Assessment (Signed)
[facility] Assessment Progress Note    WLED patient waiting to transfer to [facility]. Currently there are no 500 hall beds but discharges are expected later today.

## 2012-09-23 NOTE — ED Notes (Signed)
Pt up to the bathroom to shower and change scrubs.  Pt  Has pulled the nail off of is gt toe after the EDP evaluated it.  No bleeding noted, Pt reports it feels much better, will redress after he gets out of the shower.

## 2012-09-23 NOTE — ED Notes (Signed)
Dr Lolly Mustache and Barbee Cough NP in w/ pt

## 2012-09-23 NOTE — ED Notes (Signed)
Dr Freida Busman aware of toe-apply dressing-he will see

## 2012-09-23 NOTE — ED Notes (Signed)
1 pt belongings bag, 1 black back pk, 1 black suit case sent w/  security
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
## 2012-09-23 NOTE — Progress Notes (Signed)
Adult Psychoeducational Group Note  Date:  09/23/2012 Time:  9:11 PM  Group Topic/Focus:  Wrap-Up Group:   The focus of this group is to help patients review their daily goal of treatment and discuss progress on daily workbooks.  Participation Level:  Did Not Attend  Participation Quality:    Affect:    Cognitive:    Insight:   Engagement in Group:    Modes of Intervention:    Additional Comments:   Humberto Seals Monique 09/23/2012, 9:11 PM

## 2012-09-23 NOTE — Tx Team (Signed)
Initial Interdisciplinary Treatment Plan  PATIENT STRENGTHS: (choose at least two) Ability for insight Average or above average intelligence Capable of independent living Communication skills General fund of knowledge Motivation for treatment/growth Physical Health Work skills  PATIENT STRESSORS: Educational concerns Financial difficulties Occupational concerns Substance abuse   PROBLEM LIST: Problem List/Patient Goals Date to be addressed Date deferred Reason deferred Estimated date of resolution  Suicidal ideation 09/23/2012   D/c        Substance abuse 09/23/2012   D/c        depression 09/23/2012   D/c                           DISCHARGE CRITERIA:  Ability to meet basic life and health needs Adequate post-discharge living arrangements Improved stabilization in mood, thinking, and/or behavior Medical problems require only outpatient monitoring Motivation to continue treatment in a less acute level of care Need for constant or close observation no longer present Reduction of life-threatening or endangering symptoms to within safe limits Safe-care adequate arrangements made Verbal commitment to aftercare and medication compliance  PRELIMINARY DISCHARGE PLAN: Attend aftercare/continuing care group Attend PHP/IOP Attend 12-step recovery group Outpatient therapy Placement in alternative living arrangements  PATIENT/FAMIILY INVOLVEMENT: This treatment plan has been presented to and reviewed with the patient, Miciah Covelli.  The patient and family have been given the opportunity to ask questions and make suggestions.  Earline Mayotte 09/23/2012, 6:40 PM

## 2012-09-23 NOTE — ED Notes (Signed)
Pt stumped his lt great toe last night while walking here.  Toe nail has almost detatched and is bleeding from under the nail.  Will inform EDP for evaluation.

## 2012-09-23 NOTE — ED Notes (Signed)
Pt ambulatory to [facility] w/ security and NP

## 2012-09-23 NOTE — ED Notes (Signed)
Toe cleaned w/ sterile NS, telpha dressing applied.

## 2012-09-23 NOTE — ED Notes (Signed)
NAD, watching tv 

## 2012-09-23 NOTE — ED Notes (Signed)
Nad, reading quietly in room, feeling better after the medications

## 2012-09-23 NOTE — Progress Notes (Signed)
Patient ID: John Christian, male   DOB: October 29, 1985, 27 y.o.   MRN: [id number] D: Pt. Lying in bed "I'm just tired". Denies SHI. A: Writer introduced self to client, encouraged group. Staff will monitor q18min for safety. R: Pt. Is safe on the unit. Pt. Did not attend group, but seen later on hall interacting.

## 2012-09-23 NOTE — ED Notes (Signed)
Security contacted for transport.

## 2012-09-24 DIAGNOSIS — F39 Unspecified mood [affective] disorder: Secondary | ICD-10-CM

## 2012-09-24 DIAGNOSIS — Z765 Malingerer [conscious simulation]: Secondary | ICD-10-CM

## 2012-09-24 DIAGNOSIS — F1994 Other psychoactive substance use, unspecified with psychoactive substance-induced mood disorder: Secondary | ICD-10-CM

## 2012-09-24 MED ORDER — LORAZEPAM 1 MG PO TABS
2.0000 mg | ORAL_TABLET | ORAL | Status: AC
  Administered 2012-09-24: 2 mg via ORAL
  Filled 2012-09-24: qty 2

## 2012-09-24 NOTE — Tx Team (Signed)
Interdisciplinary Treatment Plan Update (Adult)  Date: 09/24/2012  Time Reviewed:  9:45 AM  Progress in Treatment: Attending groups: Yes Participating in groups:  Yes Taking medication as prescribed:  Yes Tolerating medication:  Yes Family/Significant othe contact made: CSW assessing Patient understands diagnosis:  Yes Discussing patient identified problems/goals with staff:  Yes Medical problems stabilized or resolved:  Yes Denies suicidal/homicidal ideation: Yes Issues/concerns per patient self-inventory:  Yes Other:  New problem(s) identified: N/A  Discharge Plan or Barriers: CSW assessing for appropriate referrals.     Reason for Continuation of Hospitalization: Anxiety Depression Medication Stabilization  Comments: N/A  Estimated length of stay: 3-5 days  For review of initial/current patient goals, please see plan of care.  Attendees: Patient:     Family:     Physician:  Dr. Johnalagadda 09/24/2012 10:21 AM   Nursing:   Jan Wright, RN 09/24/2012 10:21 AM   Clinical Social Worker:  Brodi Kari Horton, LCSWA 09/24/2012 10:21 AM   Other: Laura Davis, NP 09/24/2012 10:21 AM   Other:  Maseta Dorley, MA care coordination 09/24/2012 10:21 AM   Other:  Quylle Hodnett, LCSW 09/24/2012 10:21 AM   Other:  Andrea Thorne, RN 09/24/2012 10:21 AM  Other:    Other:    Other:    Other:    Other:    Other:     Scribe for Treatment Team:   Horton, Berlyn Saylor Nicole, 09/24/2012 10:21 AM   
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
## 2012-09-24 NOTE — [facility] Group Notes (Signed)
[facility] LCSW Group Therapy  09/24/2012  1:15 PM   Type of Therapy:  Group Therapy  Participation Level:  Did Not Attend - pt was with the PA for the duration of group and did not return after finished.    Reyes Ivan, LCSWA 09/24/2012 2:19 PM
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
## 2012-09-24 NOTE — [facility] Group Notes (Signed)
[facility] LCSW Aftercare Discharge Planning Group Note   09/24/2012 8:45 AM  Participation Quality:  Alert and Appropriate   Mood/Affect:  Appropriate, Flat and Depressed   Depression Rating:  2  Anxiety Rating:  10  Thoughts of Suicide:  Pt denies SI/HI  Will you contract for safety?   Yes  Current AVH:  No  Plan for Discharge/Comments:  Pt attended discharge planning group and actively participated in group.  CSW provided pt with today's workbook.  Pt states that he came to the hospital due to suicidal ideation.  Pt states that he has a lot of current stressors which overwhelmed him to include not being able to see his child, miscarrying twins, losing his mom to cancer, losing his job and housing.  Pt states that he was on his way from [place] to a friend in [place] and stopped in [place] due to not being able to take it anymore and feeling suicidal.  Pt reports no drug use since hospitalization here in Aug 2013, when he was using opiates and heroin.  Pt is unsure of d/c plans at this time.  No further needs voiced by pt at this time.    Transportation Means: Pt utilizes public transportation  Supports: No supports mentioned at this time  Reyes Ivan, LCSWA 09/24/2012 10:04 AM

## 2012-09-24 NOTE — Progress Notes (Signed)
Date: 09/24/2012  Time: 11:00  Group Topic/Focus:  Relapse Prevention Planning: The focus of this group is to define relapse and discuss the need for planning to combat relapse.  Participation Level: Active  Participation Quality: Appropriate and Supportive  Affect: Appropriate  Cognitive: Appropriate  Insight: Appropriate  Engagement in Group: Engaged and Supportive  Modes of Intervention: Discussion, Education and Support  Additional Comments: none Denay Pleitez M  09/24/2012, 2:57 PM  

## 2012-09-24 NOTE — Progress Notes (Signed)
Adult Psychoeducational Group Note  Date:  09/24/2012 Time:  11:02 PM  Group Topic/Focus:  Wrap-Up Group:   The focus of this group is to help patients review their daily goal of treatment and discuss progress on daily workbooks.  Participation Level:  Did Not Attend  Participation Quality:  n/a  Affect:  n/a  Cognitive:  n/a  Insight: None  Engagement in Group:  n/a  Modes of Intervention:  n/a  Additional Comments:  Pt did not attend group.  Maeola Sarah 09/24/2012, 11:02 PM

## 2012-09-24 NOTE — Progress Notes (Signed)
D:Pt requested from writer to sign 72 hr request for d/c. When asked what had changed since coming to the hospital, pt reported that he now has a place to stay in [place] with his ex girlfriend and a car. Pt denies si at this time. Pt has been anxious on the unit requesting pain medication for his back.  A:Offered ice and tylenol for break through pain and pt refused stating that it does not work. Offered support and 15 minute checks. CM notified and MD alerted.  R:Pt denies si and hi. Safety maintained.

## 2012-09-24 NOTE — [facility] Counselor (Signed)
Adult Psychosocial Assessment Update Interdisciplinary Team  Previous [facility] Hospital admissions/discharges:  Admissions Discharges  Date: 12/13/11 Date: 12/18/11  Date: Date:  Date: Date:  Date: Date:  Date: Date:   Changes since the last Psychosocial Assessment (including adherence to outpatient mental health and/or substance abuse treatment, situational issues contributing to decompensation and/or relapse). Pt states that since last hospitalization in August, he has had a lot of stressors.  Pt states that he has been unable to see his 27 year old child, miscarried twins, is losing his mom to cancer and lost his job and housing.  Pt states that he was walking from [facility] to [place] to stay with a friend and got overwhelmed and felt suicidal.  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               Discharge Plan 1. Will you be returning to the same living situation after discharge?   Yes: No: X     If no, what is your plan? Pt is homeless and will determine where he will live prior to d/c.             2. Would you like a referral for services when you are discharged? Yes:  X   If yes, for what services? CSW will assess for appropriate referral based on where pt lives.   No:              Summary and Recommendations (to be completed by the evaluator) Patient is a 27 year old Caucasian Male with a diagnosis of Major Depressive Disorder.  Patient is currently homeless.  Patient will benefit from crisis stabilization, medication evaluation, group therapy and psycho education in addition to case management for discharge planning.                         Signature:  Carmina Miller, 09/24/2012 10:13 AM

## 2012-09-25 DIAGNOSIS — F332 Major depressive disorder, recurrent severe without psychotic features: Secondary | ICD-10-CM

## 2012-09-25 MED ORDER — MUPIROCIN 2 % EX OINT
TOPICAL_OINTMENT | Freq: Every day | CUTANEOUS | Status: DC
  Filled 2012-09-25: qty 22

## 2012-09-25 MED ORDER — HYDROXYZINE HCL 25 MG PO TABS: 25.0000 mg | ORAL_TABLET | Freq: Four times a day (QID) | ORAL | Status: DC | PRN

## 2012-09-25 NOTE — Progress Notes (Signed)
John Christian is seen OOB UAL on the 500 hall today...tolerated moderately well. HE remians sad, flat, blunted. HE shows no emotion when he  Speaks and he allows only very brief eye contact.    A He takes his scheduuled meds as they are ordered and he has taken prn ultram 50 mg PO, x2 today, for c/o discomfort from his toe( where toenail was pulled off) and he is putting bacitracin on the exposed nail bed ( which is clean, has no draininage and is without  S/sx infection.   R He is attended his groups today, is learning about healthier coping skills and beginning to verbalize feelings about his issues.

## 2012-09-25 NOTE — Progress Notes (Signed)
Psychoeducational Group Note  Date: 09/25/2012 Time:  1015  Group Topic/Focus:  Identifying Needs:   The focus of this group is to help patients identify their personal needs that have been historically problematic and identify healthy behaviors to address their needs.  Participation Level:  Pt stayed in the group a few minutes and got up and left and walked up and down the hall [place], [place] A
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
## 2012-09-25 NOTE — Progress Notes (Signed)
Patient ID: John Christian, male   DOB: 1986-01-07, 27 y.o.   MRN: [id number] D)  Pt was pacing the hall this evening, agitated, anxious, wanting to leave, stated couldn't stay here, too much on his mind, threatening to "go off"  If he wasn't given something for his nerves.  PA was notified, order obtained for ativan 2 mg.po, given.  Was offered something to eat or drink, given, was becoming calmer, continued to pace the hall.  Did finally go to group, had snack afterward, was able to converse more calmly, went to bed. A)  Will continue to monitor for safety, continue POC R)  Safety maintained.

## 2012-09-25 NOTE — Progress Notes (Signed)
Psychoeducational Group Note    Date: 09/25/2012 Time:  0915   Goal Setting Purpose of Group: To be able to set a goal that is measurable and that can be accomplished in one day Participation Level:  Active  Participation Quality: Did not attend  Dione Housekeeper

## 2012-09-25 NOTE — Progress Notes (Signed)
Adult Psychoeducational Group Note  Date:  09/25/2012 Time:  0145 Group Topic/Focus:  Healthy Communication:   The focus of this group is to discuss communication, barriers to communication, as well as healthy ways to communicate with others.  Participation Level:  Active  Participation Quality:  Appropriate and Attentive  Affect:  Appropriate  Cognitive:  Alert and Appropriate  Insight: Appropriate and Good  Engagement in Group:  Engaged  Modes of Intervention:  Discussion and Education  Additional Comments:    Maretta Los 09/25/2012, 6:58 PM

## 2012-09-25 NOTE — [facility] Group Notes (Signed)
[facility] Group Notes:  (Clinical Social Work)  09/25/2012   3:00-4:00PM  Summary of Progress/Problems:   The main focus of today's process group was for the patient to identify something in their life that led to their hospitalization that they would like to change, then to discuss their motivation to change.  The Stages of Change were explained to the group, then each patient identified where they are in that process.  A scaling question was used with motivation interviewing to determine the patient's current motivation to change the identified behavior (low to high). The patient expressed that he needs to change his negativity, he is in preparation stage of change and he is 110% motivated to change.  Type of Therapy:  Process Group  Participation Level:  Active  Participation Quality:  Attentive and Sharing  Affect:  Blunted  Cognitive:  Appropriate and Oriented  Insight:  Improving  Engagement in Therapy:  Engaged  Modes of Intervention:  Education, Motivational Interviewing   Ambrose Mantle, LCSW 09/25/2012, 5:35 PM

## 2012-09-26 ENCOUNTER — Emergency Department (HOSPITAL_COMMUNITY)
Admission: EM | Admit: 2012-09-26 | Discharge: 2012-09-28 | Disposition: A | Discharge status: Other Acute Inpatient Hospital | Payer: Self-pay | Attending: Emergency Medicine | Admitting: Emergency Medicine

## 2012-09-26 ENCOUNTER — Encounter (HOSPITAL_COMMUNITY): Payer: Self-pay | Admitting: Emergency Medicine

## 2012-09-26 DIAGNOSIS — F329 Major depressive disorder, single episode, unspecified: Secondary | ICD-10-CM | POA: Insufficient documentation

## 2012-09-26 DIAGNOSIS — Z79899 Other long term (current) drug therapy: Secondary | ICD-10-CM | POA: Insufficient documentation

## 2012-09-26 DIAGNOSIS — Z880 Allergy status to penicillin: Secondary | ICD-10-CM | POA: Insufficient documentation

## 2012-09-26 DIAGNOSIS — Z8669 Personal history of other diseases of the nervous system and sense organs: Secondary | ICD-10-CM | POA: Insufficient documentation

## 2012-09-26 DIAGNOSIS — M1990 Unspecified osteoarthritis, unspecified site: Secondary | ICD-10-CM | POA: Insufficient documentation

## 2012-09-26 DIAGNOSIS — F17200 Nicotine dependence, unspecified, uncomplicated: Secondary | ICD-10-CM | POA: Insufficient documentation

## 2012-09-26 DIAGNOSIS — B182 Chronic viral hepatitis C: Secondary | ICD-10-CM | POA: Insufficient documentation

## 2012-09-26 DIAGNOSIS — F411 Generalized anxiety disorder: Secondary | ICD-10-CM | POA: Insufficient documentation

## 2012-09-26 DIAGNOSIS — F1910 Other psychoactive substance abuse, uncomplicated: Principal | ICD-10-CM

## 2012-09-26 LAB — RAPID URINE DRUG SCREEN, HOSP PERFORMED
Barbiturates: NOT DETECTED
Benzodiazepines: POSITIVE — AB
Cocaine: NOT DETECTED
Tetrahydrocannabinol: POSITIVE — AB

## 2012-09-26 LAB — COMPREHENSIVE METABOLIC PANEL
Albumin: 4.5 g/dL (ref 3.5–5.2)
BUN: 14 mg/dL (ref 6–23)
Calcium: 9.9 mg/dL (ref 8.4–10.5)
Creatinine, Ser: 0.87 mg/dL (ref 0.50–1.35)
Total Bilirubin: 0.7 mg/dL (ref 0.3–1.2)
Total Protein: 7.4 g/dL (ref 6.0–8.3)

## 2012-09-26 LAB — CBC
HCT: 37.9 % — ABNORMAL LOW (ref 39.0–52.0)
MCH: 31 pg (ref 26.0–34.0)
MCHC: 35.4 g/dL (ref 30.0–36.0)
MCV: 87.7 fL (ref 78.0–100.0)
Platelets: 213 10*3/uL (ref 150–400)
RDW: 14 % (ref 11.5–15.5)

## 2012-09-26 LAB — ETHANOL: Alcohol, Ethyl (B): <11 mg/dL (ref 0–11)

## 2012-09-26 MED ORDER — FLUOXETINE HCL 20 MG PO CAPS: 20.0000 mg | ORAL_CAPSULE | Freq: Every day | ORAL | Status: DC

## 2012-09-26 MED ORDER — FLUOXETINE HCL 20 MG PO CAPS
20.0000 mg | ORAL_CAPSULE | Freq: Every day | ORAL | Status: DC
  Administered 2012-09-26: 20 mg via ORAL
  Filled 2012-09-26: qty 1

## 2012-09-26 MED ORDER — OLANZAPINE 5 MG PO TABS: 5.0000 mg | ORAL_TABLET | Freq: Every day | ORAL | Status: DC

## 2012-09-26 MED ORDER — TRAZODONE HCL 50 MG PO TABS: 50.0000 mg | ORAL_TABLET | Freq: Every day | ORAL | Status: DC

## 2012-09-26 MED ORDER — TRAMADOL HCL 50 MG PO TABS
50.0000 mg | ORAL_TABLET | Freq: Four times a day (QID) | ORAL | Status: DC | PRN
  Administered 2012-09-26 – 2012-09-27 (×2): 50 mg via ORAL
  Filled 2012-09-26 (×2): qty 1

## 2012-09-26 MED ORDER — TRAMADOL HCL 50 MG PO TABS: 50.0000 mg | ORAL_TABLET | Freq: Four times a day (QID) | ORAL | Status: DC | PRN

## 2012-09-26 NOTE — ED Notes (Signed)
Pt given [employer] to drink  
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
## 2012-09-26 NOTE — [facility] Suicide Risk Assessment (Signed)
Suicide Risk Assessment  Discharge Assessment     Demographic Factors:  Male  Mental Status Per Nursing Assessment::   On Admission:   (denied SI at this time, contracts for safety)  Current Mental Status by Physician: No si, no hi, no avh, logical  Loss Factors: Decrease in vocational status  Historical Factors: Impulsivity  Risk Reduction Factors:   Sense of responsibility to family  Continued Clinical Symptoms:  Alcohol/Substance Abuse/Dependencies  Cognitive Features That Contribute To Risk:  Closed-mindedness    Suicide Risk:  Mild:  Suicidal ideation of limited frequency, intensity, duration, and specificity.  There are no identifiable plans, no associated intent, mild dysphoria and related symptoms, good self-control (both objective and subjective assessment), few other risk factors, and identifiable protective factors, including available and accessible social support.  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 Arthritis   . Anxiety   . Neuropathy    AXIS IV:  other psychosocial or environmental problems AXIS V:  51-60 moderate symptoms  Plan Of Care/Follow-up recommendations:  Activity:  out pt follow up. Per primary team Dr. Shela Commons pt can be discharged home today.  Is patient on multiple antipsychotic therapies at discharge:  No   Has Patient had three or more failed trials of antipsychotic monotherapy by history:  No  Recommended Plan for Multiple Antipsychotic Therapies: Additional reason(s) for multiple antispychotic treatment:  n/a  Wonda Cerise 09/26/2012, 11:26 AM

## 2012-09-26 NOTE — Progress Notes (Signed)
Psychoeducational Group Note  Date:  09/26/2012 Time:  1015  Group Topic/Focus:  Making Healthy Choices:   The focus of this group is to help patients identify negative/unhealthy choices they were using prior to admission and identify positive/healthier coping strategies to replace them upon discharge.  Participation Level:  Did not attend   Dione Housekeeper 09/26/2012

## 2012-09-26 NOTE — ED Notes (Signed)
Pt reports he is suicidal and homicidal (towards his daughter's mom) x 5 days.  Denies plan for either.  States he was discharged from [facility] today for same and states that he feels like BH made his symptoms worse.  Denies AH/VH

## 2012-09-26 NOTE — Progress Notes (Addendum)
NP completed DC order. MD completed DC SRA. Pt given AVS, it was discussed  and explained to  Him and he stated understanding. HE is given all belongings that were placed in his locker. He is given DC AVS, and states he understands info this Clinical research associate gave him. HE adamantly denies andy SI,  HI, and / or presence of audit, vis, tactile halluc. HE  Is instructed on where bus stop is ( in relationship to this hospital) and he is DC'd per MD order.

## 2012-09-26 NOTE — ED Notes (Signed)
Pt attempted to provide urine sample but unable to at this time

## 2012-09-26 NOTE — Progress Notes (Signed)
Psychoeducational Group Note  Psychoeducational Group Note  Date: 09/26/2012 Time:  09/26/2012  Group Topic/Focus:  Gratefulness:  The focus of this group is to help patients identify what two things they are most grateful for in their lives. What helps ground them and to center them on their work to their recovery.  Participation Level:  Active  Participation Quality:  Appropriate  Affect:  Appropriate  Cognitive:  Oriented  Insight:  Improving  Engagement in Group:  Engaged  Additional Comments:    Dione Housekeeper

## 2012-09-26 NOTE — ED Notes (Signed)
AC notified of BH sitter need.  Cletis Athens, RN paging security to wand pt.

## 2012-09-26 NOTE — Progress Notes (Signed)
Patient ID: John Christian, male   DOB: 1985/06/12, 27 y.o.   MRN: [id number] D)  Has been out in the hall walking frequently ,  Doesn't seem engaged with the milieu and program.  States he's doing OK and ready to go.  Attended group, said he doesn't need to be here, hoping for discharge tomorrow, can get a ride to [place]. A)  Will continue to monitor for safety, continue POC R)  Safety maintained

## 2012-09-26 NOTE — ED Provider Notes (Signed)
History    This chart was scribed for Roxy Horseman, non-physician practitioner working with Celene Kras, MD by Leone Payor, ED Scribe. This patient was seen in room TR07C/TR07C and the patient's care was started at 1952.   CSN: [id number]  Arrival date & time 09/26/12  1952   First MD Initiated Contact with Patient 09/26/12 2041      Chief Complaint  Patient presents with  . Suicidal     The history is provided by the patient. No language interpreter was used.    HPI Comments: Azavion Bouillon is a 27 y.o. male who presents to the Emergency Department complaining of ongoing SI and HI for 5 days ago. States his suicide plan would be to slit his wrists or neck. He is homicidal towards his daughter's mom and states he would potentially hurt her even though he wouldn't want to. States he was discharged from [facility] today for the same symptoms and states he feels worse after seeing them. States he has been having increased stress and trouble in his personal life involving his mother and his daughter's mother. Per pt, he has been acting before thinking lately. He describes himself as normally being a nice guy. He takes prozac daily for depression and tramadol as needed for back pain. He was recently diagnosed with Hepatitis C.    Past Medical History  Diagnosis Date  . Arthritis   . Anxiety   . Neuropathy     Past Surgical History  Procedure Laterality Date  . Tonsillectomy      Family History  Problem Relation Age of Onset  . Hypertension Other   . Cancer Other     History  Substance Use Topics  . Smoking status: Current Every Day Smoker -- 2.00 packs/day for 13 years    Types: Cigarettes  . Smokeless tobacco: Never Used  . Alcohol Use: No     Comment: last drank at age 38 yrs      Review of Systems A complete 10 system review of systems was obtained and all systems are negative except as noted in the HPI and PMH.   Allergies  Penicillins  Home Medications    Current Outpatient Rx  Name  Route  Sig  Dispense  Refill  . FLUoxetine (PROZAC) 20 MG capsule   Oral   Take 1 capsule (20 mg total) by mouth daily. For depression.      3   . traMADol (ULTRAM) 50 MG tablet   Oral   Take 1 tablet (50 mg total) by mouth every 6 (six) hours as needed for pain.   30 tablet        BP 128/81  Pulse 80  Temp(Src) 98.3 F (36.8 C) (Oral)  Resp 16  SpO2 99%  Physical Exam  Nursing note and vitals reviewed. Constitutional: He is oriented to person, place, and time. He appears well-developed and well-nourished. No distress.  HENT:  Head: Normocephalic and atraumatic.  Eyes: EOM are normal.  Neck: Neck supple. No tracheal deviation present.  Cardiovascular: Normal rate, regular rhythm and normal heart sounds.  Exam reveals no gallop and no friction rub.   No murmur heard. Pulmonary/Chest: Effort normal and breath sounds normal. No respiratory distress. He has no wheezes. He has no rales. He exhibits no tenderness.  Abdominal: He exhibits no distension.  Musculoskeletal: Normal range of motion.  Moves all extremities well.   Neurological: He is alert and oriented to person, place, and time.  Skin:  Skin is warm and dry.  Psychiatric: He is withdrawn. He exhibits a depressed mood.  Depressed and withdrawn.     ED Course  Procedures (including critical care time)  DIAGNOSTIC STUDIES: Oxygen Saturation is 99% on room air, normal by my interpretation.    COORDINATION OF CARE: 9:09 PM Discussed treatment plan with pt at bedside and pt agreed to plan.   Labs Reviewed  ACETAMINOPHEN LEVEL  CBC  COMPREHENSIVE METABOLIC PANEL  ETHANOL  SALICYLATE LEVEL  URINE RAPID DRUG SCREEN (HOSP PERFORMED)   No results found.   No diagnosis found.    MDM  Patient with SI. Will move to psych ed.  Consult with ACT. Psych hold orders are placed.       I personally performed the services described in this documentation, which was scribed in my  presence. The recorded information has been reviewed and is accurate.    Roxy Horseman, PA-C 09/27/12 0000

## 2012-09-27 MED ORDER — NICOTINE 21 MG/24HR TD PT24
21.0000 mg | MEDICATED_PATCH | Freq: Every day | TRANSDERMAL | Status: DC
  Administered 2012-09-27: 21 mg via TRANSDERMAL
  Filled 2012-09-27: qty 1

## 2012-09-27 MED ORDER — LORAZEPAM 1 MG PO TABS
1.0000 mg | ORAL_TABLET | Freq: Three times a day (TID) | ORAL | Status: DC | PRN
  Administered 2012-09-27: 1 mg via ORAL
  Filled 2012-09-27: qty 1

## 2012-09-27 MED ORDER — ALUM & MAG HYDROXIDE-SIMETH 200-200-20 MG/5ML PO SUSP: 30.0000 mL | ORAL | Status: DC | PRN

## 2012-09-27 MED ORDER — IBUPROFEN 400 MG PO TABS: 600.0000 mg | ORAL_TABLET | Freq: Three times a day (TID) | ORAL | Status: DC | PRN

## 2012-09-27 MED ORDER — ZOLPIDEM TARTRATE 5 MG PO TABS: 5.0000 mg | ORAL_TABLET | Freq: Every evening | ORAL | Status: DC | PRN

## 2012-09-27 MED ORDER — ACETAMINOPHEN 325 MG PO TABS: 650.0000 mg | ORAL_TABLET | ORAL | Status: DC | PRN

## 2012-09-27 MED ORDER — ONDANSETRON HCL 4 MG PO TABS: 4.0000 mg | ORAL_TABLET | Freq: Three times a day (TID) | ORAL | Status: DC | PRN
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
## 2012-09-27 NOTE — ED Notes (Addendum)
This pt was assessed by telepsych who reports pt is malingering and recommends discharge home with f/u at a  community mental health center. Informed Dr. Nicanor Alcon

## 2012-09-27 NOTE — BH Assessment (Signed)
[facility] Assessment Progress Note      Update:  [employer] and per [employer] @ 1010, bed may be available.  Referral faxed for review.  Called [facility] and no beds per Lupita Leash @ 1011.  Called [facility] and Trula Ore told clinician to fax referral @ 1300 @ 1012.  Called [facility] and no beds per [employer], but told clinician to fax referral for possible wait list.  Myra Gianotti and initially left message @ 1016.  Received call back from [facility] @ 1044 and they may have discharges this afternoon, so faxed referral for review.  Called [facility] and there is a wait list currently for [facility] beds, but she told clinician @ 1018 to send referral for consideration for wait list and referral faxed for review.  Called [facility] and no beds per [facility] @ 1017.  Will continue bed finding for pt.

## 2012-09-27 NOTE — ED Notes (Addendum)
BH staff  Tammy Sours  with pt now

## 2012-09-27 NOTE — BH Assessment (Signed)
Assessment Note  Update:  Laurita Quint to follow up with pt and per Shanda Bumps, pt was declined by Dr. Robet Leu @ [phone], as pt was just discharged there on 6/4.  Per [facility] @ 1735, Thayer Ohm stated pt declined due to acuity.  Per [facility], Alecia Lemming stated pt on wait list @ 1758.  Called [facility] and left ,essage to follow up @ 1801.  Called [facility] and per [facility], referral never received @ 1800.  Faxed referral for review.  Received call from [facility] stating pt accepted to Dr. Otho Perl to [place] building and that his bed is ready.  Nurse to call report at [phone].  Per [facility], as pt is voluntary, he can arrive via [facility] to their facility as long as he signs himself in.  Pt is voluntary.  ED staff to arrange transport.  Called HH to inform them pt accepted elsewhere.  Updated EDP Hyacinth Meeker and ED staff.    Disposition:  Disposition Initial Assessment Completed for this Encounter: Yes Disposition of Patient: Inpatient treatment program Type of inpatient treatment program: Adult (Pt accepted [facility])  On Site Evaluation by:   Reviewed with Physician:  Rocky Morel, Rennis Harding 09/27/2012 6:41 PM

## 2012-09-27 NOTE — ED Notes (Signed)
After video conference with psychiatry, pt had an out burst of angry and was crying,   Stated " Dr said there is no reason to admit me"

## 2012-09-27 NOTE — ED Provider Notes (Signed)
Patient has been accepted at [facility] the [facility], to be transferred.  Vida Roller, MD 09/27/12 2111

## 2012-09-27 NOTE — BH Assessment (Signed)
[facility] Assessment Progress Note      Update:  Assessed pt this AM and he is reporting SI with plan to slit his wrists.  Pt is unable to contract for safety.  Pt denies HI or psychosis.  Discussed case with EDP Pickering, as telepsych recommended discharge, and per EDP Pickering, ACT will begin bed finding for pt as he continues to report SI with plan.  Updated ED staff.

## 2012-09-27 NOTE — ED Provider Notes (Signed)
Patient came in for suicidal and homicidal thoughts. Had been at behavioral health for same. He was cleared we told him he is doing better. He was given a bus pass. Patient now states that he was lying to them because he didn't think there were helping him. Telemetry psych done yesterday stated patient was malingering. Patient states he is still suicidal. ACT team is working on placement. He does have previous suicide attempts reportedly.  Juliet Rude. Rubin Payor, MD 09/27/12 [phone]

## 2012-09-27 NOTE — BH Assessment (Signed)
[facility] Assessment Progress Note      Consulted with Verne Spurr PA re admission for the [facility] patient. Telepsych recommended discharge but the EDP and ACT staff person reassessed him and he maintains suicidal ideation and plan to slit his wrists. [facility] declined him due to his recent admission/discharge where he signed a 72 hour form for discharge and it was not felt he was benefitting from the program. Additionally, he was discharged from here 24 hours ago.

## 2012-09-28 NOTE — ED Notes (Signed)
Updated [name] and let them know that Pt. Just left this facility with [facility].

## 2012-09-28 NOTE — ED Notes (Signed)
Report given to [facility]. 

## 2012-09-29 NOTE — Progress Notes (Signed)
Patient Discharge Instructions:  No Documentation was sent for follow up as no ROI was available.  Per [facility] no follow up was scheduled as the patient left suddenly.  [facility], 09/29/2012, 4:10 PM

## 2012-09-29 NOTE — ED Provider Notes (Signed)
Medical screening examination/treatment/procedure(s) were performed by non-physician practitioner and as supervising physician I was immediately available for consultation/collaboration.    Celene Kras, MD 09/29/12 [phone]
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
## 2013-10-14 ENCOUNTER — Encounter (HOSPITAL_COMMUNITY): Payer: Self-pay | Admitting: Emergency Medicine

## 2013-10-14 ENCOUNTER — Emergency Department (HOSPITAL_COMMUNITY)
Admission: EM | Admit: 2013-10-14 | Discharge: 2013-10-15 | Disposition: A | Discharge status: Home / Self Care | Payer: Medicaid Other | Attending: Emergency Medicine | Admitting: Emergency Medicine

## 2013-10-14 DIAGNOSIS — F3289 Other specified depressive episodes: Secondary | ICD-10-CM | POA: Insufficient documentation

## 2013-10-14 DIAGNOSIS — F32A Depression, unspecified: Secondary | ICD-10-CM

## 2013-10-14 DIAGNOSIS — F1520 Other stimulant dependence, uncomplicated: Secondary | ICD-10-CM

## 2013-10-14 DIAGNOSIS — Z8739 Personal history of other diseases of the musculoskeletal system and connective tissue: Secondary | ICD-10-CM | POA: Insufficient documentation

## 2013-10-14 DIAGNOSIS — R45851 Suicidal ideations: Secondary | ICD-10-CM | POA: Insufficient documentation

## 2013-10-14 DIAGNOSIS — Z8669 Personal history of other diseases of the nervous system and sense organs: Secondary | ICD-10-CM | POA: Insufficient documentation

## 2013-10-14 DIAGNOSIS — F1210 Cannabis abuse, uncomplicated: Secondary | ICD-10-CM | POA: Insufficient documentation

## 2013-10-14 DIAGNOSIS — F1410 Cocaine abuse, uncomplicated: Secondary | ICD-10-CM | POA: Insufficient documentation

## 2013-10-14 DIAGNOSIS — F329 Major depressive disorder, single episode, unspecified: Secondary | ICD-10-CM | POA: Insufficient documentation

## 2013-10-14 DIAGNOSIS — F3130 Bipolar disorder, current episode depressed, mild or moderate severity, unspecified: Secondary | ICD-10-CM

## 2013-10-14 DIAGNOSIS — F314 Bipolar disorder, current episode depressed, severe, without psychotic features: Secondary | ICD-10-CM

## 2013-10-14 DIAGNOSIS — Z880 Allergy status to penicillin: Secondary | ICD-10-CM | POA: Insufficient documentation

## 2013-10-14 DIAGNOSIS — F17200 Nicotine dependence, unspecified, uncomplicated: Secondary | ICD-10-CM | POA: Insufficient documentation

## 2013-10-14 LAB — CBC
HEMATOCRIT: 38 % — AB (ref 39.0–52.0)
HEMOGLOBIN: 12.6 g/dL — AB (ref 13.0–17.0)
MCH: 30.4 pg (ref 26.0–34.0)
MCHC: 33.2 g/dL (ref 30.0–36.0)
MCV: 91.6 fL (ref 78.0–100.0)
Platelets: 238 10*3/uL (ref 150–400)
RBC: 4.15 MIL/uL — AB (ref 4.22–5.81)
RDW: 13.8 % (ref 11.5–15.5)
WBC: 11 10*3/uL — ABNORMAL HIGH (ref 4.0–10.5)

## 2013-10-14 LAB — COMPREHENSIVE METABOLIC PANEL
ALT: 8 U/L (ref 0–53)
AST: 14 U/L (ref 0–37)
Albumin: 4.1 g/dL (ref 3.5–5.2)
Alkaline Phosphatase: 63 U/L (ref 39–117)
BILIRUBIN TOTAL: 0.5 mg/dL (ref 0.3–1.2)
BUN: 20 mg/dL (ref 6–23)
CHLORIDE: 104 meq/L (ref 96–112)
CO2: 26 meq/L (ref 19–32)
CREATININE: 1.07 mg/dL (ref 0.50–1.35)
Calcium: 9.3 mg/dL (ref 8.4–10.5)
GFR calc Af Amer: >90 mL/min (ref >90)
GLUCOSE: 132 mg/dL — AB (ref 70–99)
Potassium: 4.4 mEq/L (ref 3.7–5.3)
Sodium: 142 mEq/L (ref 137–147)
Total Protein: 7.2 g/dL (ref 6.0–8.3)

## 2013-10-14 LAB — RAPID URINE DRUG SCREEN, HOSP PERFORMED
AMPHETAMINES: NOT DETECTED
Barbiturates: NOT DETECTED
Benzodiazepines: NOT DETECTED
Cocaine: POSITIVE — AB
Opiates: NOT DETECTED
Tetrahydrocannabinol: POSITIVE — AB

## 2013-10-14 LAB — SALICYLATE LEVEL: Salicylate Lvl: <2 mg/dL — ABNORMAL LOW (ref 2.8–20.0)

## 2013-10-14 LAB — ETHANOL

## 2013-10-14 LAB — ACETAMINOPHEN LEVEL: Acetaminophen (Tylenol), Serum: <15 ug/mL (ref 10–30)

## 2013-10-14 MED ORDER — NICOTINE 21 MG/24HR TD PT24: 21.0000 mg | MEDICATED_PATCH | Freq: Every day | TRANSDERMAL | Status: DC

## 2013-10-14 MED ORDER — HYDROXYZINE HCL 25 MG PO TABS
50.0000 mg | ORAL_TABLET | Freq: Four times a day (QID) | ORAL | Status: DC | PRN
  Administered 2013-10-14: 50 mg via ORAL
  Filled 2013-10-14: qty 2

## 2013-10-14 MED ORDER — IBUPROFEN 200 MG PO TABS: 600.0000 mg | ORAL_TABLET | Freq: Three times a day (TID) | ORAL | Status: DC | PRN

## 2013-10-14 MED ORDER — ONDANSETRON HCL 4 MG PO TABS: 4.0000 mg | ORAL_TABLET | Freq: Three times a day (TID) | ORAL | Status: DC | PRN

## 2013-10-14 MED ORDER — ZOLPIDEM TARTRATE 5 MG PO TABS: 5.0000 mg | ORAL_TABLET | Freq: Every evening | ORAL | Status: DC | PRN

## 2013-10-14 NOTE — ED Notes (Signed)
Patient states he is seeking help for suicidal ideations. Patient states he has gone through "this" one time before. Patient denies having a psychiatrist or therapist. Patient states he has thought of different ways, no specific plan. Patient states his father passed away on 08/26/13 and he has been having a hard time since then, states tonight he "lost it" and started screaming at his daughter and everyone in the room. Patient is calm, cooperative, alert and oriented at this time.

## 2013-10-14 NOTE — Consult Note (Signed)
[facility] Face-to-Face Psychiatry Consult   Reason for Consult:  Suicidal in context of grief Referring Physician:  EDP  John Christian is an 28 y.o. male. Total Time spent with patient: 20 minutes  Assessment: AXIS I:  Bipolar, Depressed AXIS II:  Deferred AXIS III:   Past Medical History  Diagnosis Date  . Arthritis   . Anxiety   . Neuropathy    AXIS IV:  economic problems, housing problems, problems related to social environment and problems with primary support group AXIS V:  31-40 impairment in reality testing  Plan:  Recommend psychiatric Inpatient admission when medically cleared.  Dr. Louretta Shorten assessed and is in agreement with the current plan   Subjective:   John Christian is a 28 y.o. male patient admitted with Suicidal ideation  HPI:  John Christian is a 28 year old caucasian male who presents to the emergency department with suicidal ideations.  States he has had constant intrusive thoughts about harming himself and feels unsafe.  States multiple recent stressors including financial difficulty, loss of a job and homelessness.  Patient states that he has difficulty sleeping, has not been eating and endorses significant worthlessness, sadness, anhedonia and guilt.  Patient's father passed away one month ago and mother has had a reoccurrence of her cancer which is weighing heavily on him currently.  Patient is able to contract for safety in the emergency department.  HPI Elements:   Location:  generalized. Quality:  acute. Severity:  severe. Timing:  ongoing. Duration:  acute exacerbation of chronic mental illness. Context:  increased stressors and grief.  Past Psychiatric History: Past Medical History  Diagnosis Date  . Arthritis   . Anxiety   . Neuropathy     reports that he has been smoking Cigarettes.  He has a 13 pack-year smoking history. He has never used smokeless tobacco. He reports that he uses illicit drugs (Marijuana and Cocaine) about 3 times per week. He reports  that he does not drink alcohol. Family History  Problem Relation Age of Onset  . Hypertension Other   . Cancer Other            Allergies:   Allergies  Allergen Reactions  . Penicillins Itching    ACT Assessment Complete:  Yes:    Educational Status    Risk to Self: Risk to self Is patient at risk for suicide?: Yes Substance abuse history and/or treatment for substance abuse?: Yes  Risk to Others:    Abuse:    Prior Inpatient Therapy:    Prior Outpatient Therapy:    Additional Information:                    Objective: Blood pressure 114/68, pulse 68, temperature 97.6 F (36.4 C), temperature source Oral, resp. rate 18, SpO2 94.00%.There is no weight on file to calculate BMI. Results for orders placed during the hospital encounter of 10/14/13 (from the past 72 hour(s))  ACETAMINOPHEN LEVEL     Status: None   Collection Time    10/14/13  2:29 AM      Result Value Ref Range   Acetaminophen (Tylenol), Serum <15.0  10 - 30 ug/mL   Comment:            THERAPEUTIC CONCENTRATIONS VARY     SIGNIFICANTLY. A RANGE OF 10-30     ug/mL MAY BE AN EFFECTIVE     CONCENTRATION FOR MANY PATIENTS.     HOWEVER, SOME ARE BEST TREATED     AT  CONCENTRATIONS OUTSIDE THIS     RANGE.     ACETAMINOPHEN CONCENTRATIONS     >150 ug/mL AT 4 HOURS AFTER     INGESTION AND >50 ug/mL AT 12     HOURS AFTER INGESTION ARE     OFTEN ASSOCIATED WITH TOXIC     REACTIONS.  CBC     Status: Abnormal   Collection Time    10/14/13  2:29 AM      Result Value Ref Range   WBC 11.0 (*) 4.0 - 10.5 K/uL   RBC 4.15 (*) 4.22 - 5.81 MIL/uL   Hemoglobin 12.6 (*) 13.0 - 17.0 g/dL   HCT 38.0 (*) 39.0 - 52.0 %   MCV 91.6  78.0 - 100.0 fL   MCH 30.4  26.0 - 34.0 pg   MCHC 33.2  30.0 - 36.0 g/dL   RDW 13.8  11.5 - 15.5 %   Platelets 238  150 - 400 K/uL  COMPREHENSIVE METABOLIC PANEL     Status: Abnormal   Collection Time    10/14/13  2:29 AM      Result Value Ref Range   Sodium 142  137 - 147  mEq/L   Potassium 4.4  3.7 - 5.3 mEq/L   Chloride 104  96 - 112 mEq/L   CO2 26  19 - 32 mEq/L   Glucose, Bld 132 (*) 70 - 99 mg/dL   BUN 20  6 - 23 mg/dL   Creatinine, Ser 1.07  0.50 - 1.35 mg/dL   Calcium 9.3  8.4 - 10.5 mg/dL   Total Protein 7.2  6.0 - 8.3 g/dL   Albumin 4.1  3.5 - 5.2 g/dL   AST 14  0 - 37 U/L   ALT 8  0 - 53 U/L   Alkaline Phosphatase 63  39 - 117 U/L   Total Bilirubin 0.5  0.3 - 1.2 mg/dL   GFR calc non Af Amer >90  >90 mL/min   GFR calc Af Amer >90  >90 mL/min   Comment: (NOTE)     The eGFR has been calculated using the CKD EPI equation.     This calculation has not been validated in all clinical situations.     eGFR's persistently <90 mL/min signify possible Chronic Kidney     Disease.  John Christian     Status: None   Collection Time    10/14/13  2:29 AM      Result Value Ref Range   Alcohol, Ethyl (B) <11  0 - 11 mg/dL   Comment:            LOWEST DETECTABLE LIMIT FOR     SERUM ALCOHOL IS 11 mg/dL     FOR MEDICAL PURPOSES ONLY  SALICYLATE LEVEL     Status: Abnormal   Collection Time    10/14/13  2:29 AM      Result Value Ref Range   Salicylate Lvl <2.8 (*) 2.8 - 20.0 mg/dL  URINE RAPID DRUG SCREEN (HOSP PERFORMED)     Status: Abnormal   Collection Time    10/14/13  2:29 AM      Result Value Ref Range   Opiates NONE DETECTED  NONE DETECTED   Cocaine POSITIVE (*) NONE DETECTED   Benzodiazepines NONE DETECTED  NONE DETECTED   Amphetamines NONE DETECTED  NONE DETECTED   Tetrahydrocannabinol POSITIVE (*) NONE DETECTED   Barbiturates NONE DETECTED  NONE DETECTED   Comment:  DRUG SCREEN FOR MEDICAL PURPOSES     ONLY.  IF CONFIRMATION IS NEEDED     FOR ANY PURPOSE, NOTIFY LAB     WITHIN 5 DAYS.                LOWEST DETECTABLE LIMITS     FOR URINE DRUG SCREEN     Drug Class       Cutoff (ng/mL)     Amphetamine      1000     Barbiturate      200     Benzodiazepine   300     Tricyclics       762     Opiates          300     Cocaine           300     THC              50   Labs are reviewed and are pertinent for positive drug screen including THC  And Cocaine.  Current Facility-Administered Medications  Medication Dose Route Frequency John Christian Last Rate Last Dose  . hydrOXYzine (ATARAX/VISTARIL) tablet 50 mg  50 mg Oral Q6H PRN Waylan Boga, NP   50 mg at 10/14/13 1412  . ibuprofen (ADVIL,MOTRIN) tablet 600 mg  600 mg Oral Q8H PRN Martie Lee, PA-C      . ondansetron [facility]) tablet 4 mg  4 mg Oral Q8H PRN Ruthell Rummage Dammen, PA-C      . zolpidem (AMBIEN) tablet 5 mg  5 mg Oral QHS PRN Martie Lee, PA-C       No current outpatient prescriptions on file.    Psychiatric Specialty Exam:     Blood pressure 114/68, pulse 68, temperature 97.6 F (36.4 C), temperature source Oral, resp. rate 18, SpO2 94.00%.There is no weight on file to calculate BMI.  General Appearance: Casual  Eye Contact::  Fair  Speech:  Clear and Coherent  Volume:  Normal  Mood:  Depressed  Affect:  Congruent  Thought Process:  Goal Directed and Logical  Orientation:  Full (Time, Place, and Person)  Thought Content:  WDL  Suicidal Thoughts:  Yes.  with intent/plan  Homicidal Thoughts:  No  Memory:  Immediate;   Good Recent;   Good Remote;   Good  Judgement:  Fair  Insight:  Fair  Psychomotor Activity:  Decreased  Concentration:  Fair  Recall:  Good  Fund of Knowledge:Good  Language: Good  Akathisia:  No  Handed:  Right  AIMS (if indicated):     Assets:  Communication Skills Desire for Improvement Resilience  Sleep:      Musculoskeletal: Strength & Muscle Tone: within normal limits Gait & Station: normal Patient leans: N/A  Treatment Plan Summary: Daily contact with patient to assess and evaluate symptoms and progress in treatment Medication management  Recommend inpatient psychiatric hospitalization for stabilization of mood   Waylan Boga  PMH-NP 10/14/2013 3:10 PM  Patient is seen face to face for psychiatric evaluation,  case discussed with physician extender and formulated treatment plan. Reviewed the information documented and agree with the treatment plan.  JONNALAGADDA,JANARDHAHA R. 10/27/2013 5:07 PM

## 2013-10-14 NOTE — ED Notes (Signed)
[facility] [place]  Pending [facility].

## 2013-10-14 NOTE — ED Notes (Signed)
PA at bedside.

## 2013-10-14 NOTE — ED Notes (Signed)
Pt transferred from triage, presents with complaint of SI, no plan.  Pt reports his father passed away 08/26/13 and since then he states things are falling apart.  Pt states his is irritable, angry, hopeless and screamed at 758 yr old daughter last night.  Pt admits to SI in the past. 2013 by OD of pills.  Denies HI, or AV hallucinations.  Pt states he abuses marijuana and Suboxone.  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  Pt calm, cooperative and sad.

## 2013-10-14 NOTE — ED Provider Notes (Signed)
CSN: [id number]     Arrival date & time 10/14/13  0152 History   First MD Initiated Contact with Patient 10/14/13 0444     Chief Complaint  Patient presents with  . Suicidal   HPI  History provided by the patient. Patient is a 28 year old male with previous history of depression presenting with worsening depression and suicidal thoughts. Patient was treated for his depression in the past but has not been on medications for many months. He states his depression is worsened after his father died in May. He has also been having difficult time with other relationships including breaking up with a girlfriend recently. He admits to outbursts of anger at times and has been feeling very guilty and bad about his behavior. He also reports having history of narcotic addiction was previously being treated with Suboxone but lost his insurance now he is buying Suboxone off the street. Denies any other drug use or alcohol use. Denies any HI or examination.    Past Medical History  Diagnosis Date  . Arthritis   . Anxiety   . Neuropathy    Past Surgical History  Procedure Laterality Date  . Tonsillectomy     Family History  Problem Relation Age of Onset  . Hypertension Other   . Cancer Other    History  Substance Use Topics  . Smoking status: Current Every Day Smoker -- 1.00 packs/day for 13 years    Types: Cigarettes  . Smokeless tobacco: Never Used  . Alcohol Use: No     Comment: last drank at age 28 yrs    Review of Systems  All other systems reviewed and are negative.     Allergies  Penicillins  Home Medications   Prior to Admission medications   Not on File   BP 119/67  Pulse 70  Temp(Src) 98.2 F (36.8 C) (Oral)  Resp 18  SpO2 98% Physical Exam  Nursing note and vitals reviewed. Constitutional: He is oriented to person, place, and time. He appears well-developed and well-nourished. No distress.  HENT:  Head: Normocephalic.  Cardiovascular: Normal rate and regular  rhythm.   Pulmonary/Chest: Effort normal and breath sounds normal. No respiratory distress. He has no wheezes.  Abdominal: Soft.  Neurological: He is alert and oriented to person, place, and time.  Skin: Skin is warm.  Psychiatric: He exhibits a depressed mood. He expresses suicidal ideation.    ED Course  Procedures   COORDINATION OF CARE:  Nursing notes reviewed. Vital signs reviewed. Initial pt interview and examination performed.   Filed Vitals:   10/14/13 0214  BP: 119/67  Pulse: 70  Temp: 98.2 F (36.8 C)  TempSrc: Oral  Resp: 18  SpO2: 98%    5:51 AM-patient seen and evaluated. Well-appearing no acute distress. Continues to express some frustration with stress and depression and suicidal ideation. Does not have a clear plan but does state he has thought about walking into traffic.  Psychiatric holding orders in place. TTS consult placed. Patient has been medically screened and is cleared for further psychiatric evaluation.   Results for orders placed during the hospital encounter of 10/14/13  ACETAMINOPHEN LEVEL      Result Value Ref Range   Acetaminophen (Tylenol), Serum <15.0  10 - 30 ug/mL  CBC      Result Value Ref Range   WBC 11.0 (*) 4.0 - 10.5 K/uL   RBC 4.15 (*) 4.22 - 5.81 MIL/uL   Hemoglobin 12.6 (*) 13.0 - 17.0 g/dL  HCT 38.0 (*) 39.0 - 52.0 %   MCV 91.6  78.0 - 100.0 fL   MCH 30.4  26.0 - 34.0 pg   MCHC 33.2  30.0 - 36.0 g/dL   RDW 57.813.8  46.911.5 - 62.915.5 %   Platelets 238  150 - 400 K/uL  COMPREHENSIVE METABOLIC PANEL      Result Value Ref Range   Sodium 142  137 - 147 mEq/L   Potassium 4.4  3.7 - 5.3 mEq/L   Chloride 104  96 - 112 mEq/L   CO2 26  19 - 32 mEq/L   Glucose, Bld 132 (*) 70 - 99 mg/dL   BUN 20  6 - 23 mg/dL   Creatinine, Ser 5.281.07  0.50 - 1.35 mg/dL   Calcium 9.3  8.4 - 41.310.5 mg/dL   Total Protein 7.2  6.0 - 8.3 g/dL   Albumin 4.1  3.5 - 5.2 g/dL   AST 14  0 - 37 U/L   ALT 8  0 - 53 U/L   Alkaline Phosphatase 63  39 - 117 U/L    Total Bilirubin 0.5  0.3 - 1.2 mg/dL   GFR calc non Af Amer >90  >90 mL/min   GFR calc Af Amer >90  >90 mL/min  ETHANOL      Result Value Ref Range   Alcohol, Ethyl (B) <11  0 - 11 mg/dL  SALICYLATE LEVEL      Result Value Ref Range   Salicylate Lvl <2.0 (*) 2.8 - 20.0 mg/dL  URINE RAPID DRUG SCREEN (HOSP PERFORMED)      Result Value Ref Range   Opiates NONE DETECTED  NONE DETECTED   Cocaine POSITIVE (*) NONE DETECTED   Benzodiazepines NONE DETECTED  NONE DETECTED   Amphetamines NONE DETECTED  NONE DETECTED   Tetrahydrocannabinol POSITIVE (*) NONE DETECTED   Barbiturates NONE DETECTED  NONE DETECTED    MDM   Final diagnoses:  Depression  Suicidal ideation      Angus Sellereter S Dammen, PA-C 10/14/13 [phone]

## 2013-10-15 ENCOUNTER — Inpatient Hospital Stay (HOSPITAL_COMMUNITY)
Admission: AD | Admit: 2013-10-15 | Discharge: 2013-10-18 | DRG: 885 | Disposition: A | Discharge status: Home / Self Care | Payer: Medicaid Other | Source: Intra-hospital | Attending: Psychiatry | Admitting: Psychiatry

## 2013-10-15 ENCOUNTER — Encounter (HOSPITAL_COMMUNITY): Payer: Self-pay

## 2013-10-15 DIAGNOSIS — F17200 Nicotine dependence, unspecified, uncomplicated: Secondary | ICD-10-CM | POA: Diagnosis present

## 2013-10-15 DIAGNOSIS — F321 Major depressive disorder, single episode, moderate: Secondary | ICD-10-CM | POA: Diagnosis present

## 2013-10-15 DIAGNOSIS — Z5900 Homelessness unspecified: Secondary | ICD-10-CM

## 2013-10-15 DIAGNOSIS — Z598 Other problems related to housing and economic circumstances: Secondary | ICD-10-CM

## 2013-10-15 DIAGNOSIS — F332 Major depressive disorder, recurrent severe without psychotic features: Principal | ICD-10-CM | POA: Diagnosis present

## 2013-10-15 DIAGNOSIS — F1910 Other psychoactive substance abuse, uncomplicated: Secondary | ICD-10-CM

## 2013-10-15 DIAGNOSIS — M129 Arthropathy, unspecified: Secondary | ICD-10-CM | POA: Diagnosis present

## 2013-10-15 DIAGNOSIS — Z5987 Material hardship due to limited financial resources, not elsewhere classified: Secondary | ICD-10-CM

## 2013-10-15 DIAGNOSIS — F1210 Cannabis abuse, uncomplicated: Secondary | ICD-10-CM | POA: Diagnosis present

## 2013-10-15 DIAGNOSIS — G4700 Insomnia, unspecified: Secondary | ICD-10-CM | POA: Diagnosis present

## 2013-10-15 DIAGNOSIS — F1123 Opioid dependence with withdrawal: Secondary | ICD-10-CM

## 2013-10-15 DIAGNOSIS — R45851 Suicidal ideations: Secondary | ICD-10-CM

## 2013-10-15 DIAGNOSIS — F3130 Bipolar disorder, current episode depressed, mild or moderate severity, unspecified: Secondary | ICD-10-CM | POA: Diagnosis present

## 2013-10-15 DIAGNOSIS — F1120 Opioid dependence, uncomplicated: Secondary | ICD-10-CM | POA: Diagnosis present

## 2013-10-15 DIAGNOSIS — F1994 Other psychoactive substance use, unspecified with psychoactive substance-induced mood disorder: Secondary | ICD-10-CM | POA: Diagnosis present

## 2013-10-15 DIAGNOSIS — F1410 Cocaine abuse, uncomplicated: Secondary | ICD-10-CM | POA: Diagnosis present

## 2013-10-15 MED ORDER — NAPROXEN 500 MG PO TABS: 500.0000 mg | ORAL_TABLET | Freq: Two times a day (BID) | ORAL | Status: DC | PRN

## 2013-10-15 MED ORDER — ONDANSETRON HCL 4 MG PO TABS: 4.0000 mg | ORAL_TABLET | Freq: Three times a day (TID) | ORAL | Status: DC | PRN

## 2013-10-15 MED ORDER — METHOCARBAMOL 500 MG PO TABS
500.0000 mg | ORAL_TABLET | Freq: Three times a day (TID) | ORAL | Status: DC | PRN
  Filled 2013-10-15: qty 1

## 2013-10-15 MED ORDER — DICYCLOMINE HCL 20 MG PO TABS: 20.0000 mg | ORAL_TABLET | Freq: Four times a day (QID) | ORAL | Status: DC | PRN

## 2013-10-15 MED ORDER — CLONIDINE HCL 0.1 MG PO TABS
0.1000 mg | ORAL_TABLET | ORAL | Status: DC
  Filled 2013-10-15 (×4): qty 1

## 2013-10-15 MED ORDER — CLONIDINE HCL 0.1 MG PO TABS
0.1000 mg | ORAL_TABLET | Freq: Four times a day (QID) | ORAL | Status: AC
  Administered 2013-10-15 (×2): 0.1 mg via ORAL
  Filled 2013-10-15 (×9): qty 1

## 2013-10-15 MED ORDER — IBUPROFEN 600 MG PO TABS: 600.0000 mg | ORAL_TABLET | Freq: Three times a day (TID) | ORAL | Status: DC | PRN

## 2013-10-15 MED ORDER — MAGNESIUM HYDROXIDE 400 MG/5ML PO SUSP: 30.0000 mL | Freq: Every day | ORAL | Status: DC | PRN

## 2013-10-15 MED ORDER — HYDROXYZINE HCL 50 MG PO TABS: 50.0000 mg | ORAL_TABLET | Freq: Every evening | ORAL | Status: DC | PRN

## 2013-10-15 MED ORDER — ONDANSETRON 4 MG PO TBDP: 4.0000 mg | ORAL_TABLET | Freq: Four times a day (QID) | ORAL | Status: DC | PRN

## 2013-10-15 MED ORDER — CLONIDINE HCL 0.1 MG PO TABS
0.1000 mg | ORAL_TABLET | Freq: Every day | ORAL | Status: DC
  Filled 2013-10-15: qty 1

## 2013-10-15 MED ORDER — LOPERAMIDE HCL 2 MG PO CAPS: 2.0000 mg | ORAL_CAPSULE | ORAL | Status: DC | PRN

## 2013-10-15 MED ORDER — TRAZODONE HCL 50 MG PO TABS
25.0000 mg | ORAL_TABLET | Freq: Every day | ORAL | Status: DC
  Filled 2013-10-15 (×4): qty 1

## 2013-10-15 MED ORDER — HYDROXYZINE HCL 50 MG PO TABS
50.0000 mg | ORAL_TABLET | Freq: Four times a day (QID) | ORAL | Status: DC | PRN
  Administered 2013-10-15: 50 mg via ORAL
  Filled 2013-10-15 (×2): qty 1

## 2013-10-15 MED ORDER — HYDROXYZINE HCL 25 MG PO TABS: 25.0000 mg | ORAL_TABLET | Freq: Four times a day (QID) | ORAL | Status: DC | PRN

## 2013-10-15 NOTE — [facility] Suicide Risk Assessment (Signed)
Suicide Risk Assessment  Admission Assessment     Nursing information obtained from:  Patient Demographic factors:  Male;Caucasian Current Mental Status:  Suicidal ideation indicated by patient Loss Factors:  Decrease in vocational status;Financial problems / change in socioeconomic status Historical Factors:  Prior suicide attempts Risk Reduction Factors:  Responsible for children under 28 years of age Total Time spent with patient: 45 minutes  CLINICAL FACTORS:   Depression:   Comorbid alcohol abuse/dependence Severe Alcohol/Substance Abuse/Dependencies  Psychiatric Specialty Exam:     Blood pressure 106/71, pulse 64, temperature 98 F (36.7 C), temperature source Oral, resp. rate 16, height 5\' 10"  (1.778 m), weight 67.586 kg (149 lb), SpO2 98.00%.Body mass index is 21.38 kg/(m^2).  General Appearance: Fairly Groomed  Patent attorneyye Contact::  Fair  Speech:  Clear and Coherent  Volume:  Decreased  Mood:  Anxious and Depressed  Affect:  Depressed  Thought Process:  Coherent and Goal Directed  Orientation:  Full (Time, Place, and Person)  Thought Content:  symtpoms, events, worries, concerns  Suicidal Thoughts:  No  Homicidal Thoughts:  No  Memory:  Immediate;   Fair Recent;   Fair Remote;   Fair  Judgement:  Fair  Insight:  Present  Psychomotor Activity:  Restlessness  Concentration:  Fair  Recall:  FiservFair  Fund of Knowledge:NA  Language: Fair  Akathisia:  No  Handed:    AIMS (if indicated):     Assets:  Desire for Improvement  Sleep:  Number of Hours: 3.75   Musculoskeletal: Strength & Muscle Tone: within normal limits Gait & Station: normal Patient leans: N/A  COGNITIVE FEATURES THAT CONTRIBUTE TO RISK:  Closed-mindedness Polarized thinking Thought constriction (tunnel vision)    SUICIDE RISK:   Moderate:  Frequent suicidal ideation with limited intensity, and duration, some specificity in terms of plans, no associated intent, good self-control, limited  dysphoria/symptomatology, some risk factors present, and identifiable protective factors, including available and accessible social support.  PLAN OF CARE: Supportive approach/coping skills/relapse prevention                               Clonidine detox protocol                               CBT;mindfulness/grief loss                               Reassess and address the co morbidities  I certify that inpatient services furnished can reasonably be expected to improve the patient's condition.  LUGO,IRVING A 10/15/2013, 12:32 PM

## 2013-10-15 NOTE — H&P (Signed)
Psychiatric Admission Assessment Adult  Patient Identification:  John Christian Date of Evaluation:  10/15/2013 Chief Complaint:  BIPOLAR-DEPRESSED History of Present Illness::   John Christian is a 28 year old caucasian male who presents to the emergency department with suicidal ideations. States he has had constant intrusive thoughts about harming himself and feels unsafe. States multiple recent stressors including financial difficulty, loss of a job and homelessness. Patient states that he has difficulty sleeping, has not been eating and endorses significant worthlessness, sadness, anhedonia and guilt.  Patient has been homeless since his fathers' death. Patient's father passed away one month ago and mother has had a reoccurrence of her cancer which is weighing heavily on him currently. Patient is able to contract for safety in the emergency department.   Depression 6/10 Anxiety 8/10.  Appetite is good.  Sleep fair   Reports suicidal fleeting thoughs, no plan, he is able to contract for safety.  Thoughts increase when he is by himself,     Elements:  Location:  [place]. Quality:  multiple losses, dealth of father, now homeless, no job. Severity:  severe. Timing:  worsening over the past 6 months. Duration:  chronic. Context:  death of father. Associated Signs/Synptoms: Depression Symptoms:  depressed mood, feelings of worthlessness/guilt, hopelessness, loss of energy/fatigue, disturbed sleep, (Hypo) Manic Symptoms:   Anxiety Symptoms:  Excessive Worry, Psychotic Symptoms:  PTSD Symptoms:  Total Time spent with patient: 30 minutes  Psychiatric Specialty Exam: Physical Exam  Constitutional: He is oriented to person, place, and time. He appears well-developed and well-nourished.  HENT:  Head: Normocephalic.  Musculoskeletal: Normal range of motion.  Neurological: He is alert and oriented to person, place, and time.  Skin: Skin is warm and dry.    ROS  Blood pressure 106/71, pulse  64, temperature 98 F (36.7 C), temperature source Oral, resp. rate 16, height $RemoveBe'5\' 10"'JbRilYspq$  (1.778 m), weight 67.586 kg (149 lb), SpO2 98.00%.Body mass index is 21.38 kg/(m^2).  General Appearance: Disheveled  Eye Sport and exercise psychologist::  Fair  Speech:  Normal Rate  Volume:  Normal  Mood:  Depressed  Affect:  Depressed  Thought Process:  Circumstantial  Orientation:  Full (Time, Place, and Person)  Thought Content:  Negative  Suicidal Thoughts:  No  Homicidal Thoughts:  No  Memory:  Immediate;   Fair Recent;   Fair Remote;   Fair  Judgement:  Fair  Insight:  Fair  Psychomotor Activity:  Negative  Concentration:  Fair  Recall:  [employer] of Knowledge:Fair  Language: Fair  Akathisia:  Negative  Handed:  Right  AIMS (if indicated):     Assets:  Communication Skills Desire for Improvement Physical Health Resilience  Sleep:  Number of Hours: 3.75    Musculoskeletal: Strength & Muscle Tone: within normal limits Gait & Station: normal Patient leans: N/A  Past Psychiatric History: Diagnosis:  Hospitalizations:  Outpatient Care:  Substance Abuse Care:  Self-Mutilation:  Suicidal Attempts:  Violent Behaviors:   Past Medical History:   Past Medical History  Diagnosis Date  . Arthritis   . Anxiety   . Neuropathy    None. Allergies:   Allergies  Allergen Reactions  . Penicillins Itching   PTA Medications: No prescriptions prior to admission    Previous Psychotropic Medications:  Medication/Dose    See mar             Substance Abuse History in the last 12 months:  Yes.    Consequences of Substance Abuse: Family Consequences:  family relationships  disruppted  Social History:  reports that he has been smoking Cigarettes.  He has a 13 pack-year smoking history. He has never used smokeless tobacco. He reports that he uses illicit drugs (Marijuana and Cocaine) about 3 times per week. He reports that he does not drink alcohol. Additional Social History:                       Current Place of Residence:   Place of Birth:   Family Members: Marital Status:  Single Children:  Sons:  Daughters: one age 34  Relationships: Education:  9th grade Educational Problems/Performance: Religious Beliefs/Practices:none History of Abuse (Emotional/Phsycial/Sexual) Ship broker History:  None. Legal History: Breaking and entering  Hobbies/Interests: none  Family History:   Family History  Problem Relation Age of Onset  . Hypertension Other   . Cancer Other     Results for orders placed during the hospital encounter of 10/14/13 (from the past 72 hour(s))  ACETAMINOPHEN LEVEL     Status: None   Collection Time    10/14/13  2:29 AM      Result Value Ref Range   Acetaminophen (Tylenol), Serum <15.0  10 - 30 ug/mL   Comment:            THERAPEUTIC CONCENTRATIONS VARY     SIGNIFICANTLY. A RANGE OF 10-30     ug/mL MAY BE AN EFFECTIVE     CONCENTRATION FOR MANY PATIENTS.     HOWEVER, SOME ARE BEST TREATED     AT CONCENTRATIONS OUTSIDE THIS     RANGE.     ACETAMINOPHEN CONCENTRATIONS     >150 ug/mL AT 4 HOURS AFTER     INGESTION AND >50 ug/mL AT 12     HOURS AFTER INGESTION ARE     OFTEN ASSOCIATED WITH TOXIC     REACTIONS.  CBC     Status: Abnormal   Collection Time    10/14/13  2:29 AM      Result Value Ref Range   WBC 11.0 (*) 4.0 - 10.5 K/uL   RBC 4.15 (*) 4.22 - 5.81 MIL/uL   Hemoglobin 12.6 (*) 13.0 - 17.0 g/dL   HCT 38.0 (*) 39.0 - 52.0 %   MCV 91.6  78.0 - 100.0 fL   MCH 30.4  26.0 - 34.0 pg   MCHC 33.2  30.0 - 36.0 g/dL   RDW 13.8  11.5 - 15.5 %   Platelets 238  150 - 400 K/uL  COMPREHENSIVE METABOLIC PANEL     Status: Abnormal   Collection Time    10/14/13  2:29 AM      Result Value Ref Range   Sodium 142  137 - 147 mEq/L   Potassium 4.4  3.7 - 5.3 mEq/L   Chloride 104  96 - 112 mEq/L   CO2 26  19 - 32 mEq/L   Glucose, Bld 132 (*) 70 - 99 mg/dL   BUN 20  6 - 23 mg/dL   Creatinine, Ser 1.07  0.50 - 1.35  mg/dL   Calcium 9.3  8.4 - 10.5 mg/dL   Total Protein 7.2  6.0 - 8.3 g/dL   Albumin 4.1  3.5 - 5.2 g/dL   AST 14  0 - 37 U/L   ALT 8  0 - 53 U/L   Alkaline Phosphatase 63  39 - 117 U/L   Total Bilirubin 0.5  0.3 - 1.2 mg/dL   GFR calc non Af Amer >90  >  90 mL/min   GFR calc Af Amer >90  >90 mL/min   Comment: (NOTE)     The eGFR has been calculated using the CKD EPI equation.     This calculation has not been validated in all clinical situations.     eGFR's persistently <90 mL/min signify possible Chronic Kidney     Disease.  ETHANOL     Status: None   Collection Time    10/14/13  2:29 AM      Result Value Ref Range   Alcohol, Ethyl (B) <11  0 - 11 mg/dL   Comment:            LOWEST DETECTABLE LIMIT FOR     SERUM ALCOHOL IS 11 mg/dL     FOR MEDICAL PURPOSES ONLY  SALICYLATE LEVEL     Status: Abnormal   Collection Time    10/14/13  2:29 AM      Result Value Ref Range   Salicylate Lvl <1.4 (*) 2.8 - 20.0 mg/dL  URINE RAPID DRUG SCREEN (HOSP PERFORMED)     Status: Abnormal   Collection Time    10/14/13  2:29 AM      Result Value Ref Range   Opiates NONE DETECTED  NONE DETECTED   Cocaine POSITIVE (*) NONE DETECTED   Benzodiazepines NONE DETECTED  NONE DETECTED   Amphetamines NONE DETECTED  NONE DETECTED   Tetrahydrocannabinol POSITIVE (*) NONE DETECTED   Barbiturates NONE DETECTED  NONE DETECTED   Comment:            DRUG SCREEN FOR MEDICAL PURPOSES     ONLY.  IF CONFIRMATION IS NEEDED     FOR ANY PURPOSE, NOTIFY LAB     WITHIN 5 DAYS.                LOWEST DETECTABLE LIMITS     FOR URINE DRUG SCREEN     Drug Class       Cutoff (ng/mL)     Amphetamine      1000     Barbiturate      200     Benzodiazepine   782     Tricyclics       956     Opiates          300     Cocaine          300     THC              50   Psychological Evaluations:  Assessment:   DSM5:  Schizophrenia Disorders: Substance/Addictive Disorders: Substance abuse, heroine, opiods Depressive  Disorders:  Major Depressive Disorder - Severe (296.23)  AXIS I:  Major Depression, Recurrent severe and Substance Abuse AXIS II:  Deferred AXIS III:   Past Medical History  Diagnosis Date  . Arthritis   . Anxiety   . Neuropathy    AXIS IV:  economic problems, educational problems, housing problems, occupational problems, other psychosocial or environmental problems, problems related to legal system/crime, problems related to social environment and problems with primary support group AXIS V:  41-50 serious symptoms  Treatment Plan/Recommendations:    Treatment Plan Summary: Review of chart, vital signs, medications and notes Daily contact with the patient to assess and evaluate synmptoms and progress in treatment  Plan: 1. Continue crisis management and stabilization. Estimated length of stay 5-7 days  2.  Medication management to reduce current symptoms to base line and improve patient's overall level of functioning     -  successful detox      -consider antidepressant medications as indicated Individual and group therapy encouraged Coping skills for depression, substance abuse, and anxiety -we discussed cognitive therapy, grief and loss  3.  Treat health problems as indicated. 4.  Develop treatment plan to decrease risk of relapse upon discharge and the need for readmission 5.  Psych-social education regarding relapse prevention and self care. 6.  Health care follow up as needed for medical problems 7.  Continue home medications where appropriate 8.  Disposition in progress     pateint is homeless  Current Medications:  Current Facility-Administered Medications  Medication Dose Route Frequency Provider Last Rate Last Dose  . hydrOXYzine (ATARAX/VISTARIL) tablet 50 mg  50 mg Oral Q6H PRN Lurena Nida, NP      . ibuprofen (ADVIL,MOTRIN) tablet 600 mg  600 mg Oral Q8H PRN Lurena Nida, NP      . magnesium hydroxide (MILK OF MAGNESIA) suspension 30 mL  30 mL Oral Daily PRN Lurena Nida, NP      . ondansetron (ZOFRAN) tablet 4 mg  4 mg Oral Q8H PRN Lurena Nida, NP        Observation Level/Precautions:  15 minute checks  Laboratory:  Reviewed abs from admission  Psychotherapy:  none  Medications:  See mar  Consultations:    Discharge Concerns:  Housing and support resources  Estimated LOS:  Other:     I certify that inpatient services furnished can reasonably be expected to improve the patient's condition.   Wadie Lessen  PMHNP 6/27/20158:51 AM I personally assessed the patient, reviewed the physical exam and labs and formulated the treatment plan

## 2013-10-15 NOTE — Progress Notes (Signed)
Patient ID: John Christian, male   DOB: [birth year], 28 y.o.   MRN: [id number]   D: Pt has been very flat and depressed on the unit today, pt has also been very irritable. 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 Pts reported that no one has addressed his substance abuse problem, and that was a issue for him. Pt reported that he also wanted something for depression, due to the recent loss of his father. Pt was offered chaplin services, however patient declined services. Also John DandyMary NP made aware of substance abuse problem, she advised me to inform Dr. [name] so that patient could be seen. Dr. [name] was made aware, patient was started on medication and will be moved to [address]. Pt reported being negative SI/HI, no AH/VH noted. A: 15 min checks continued for patient safety. R: Pt safety maintained.

## 2013-10-15 NOTE — Progress Notes (Signed)
D.  Pt flat on approach, denied need for withdrawal medication as well as sleep medication at this time.  Positive for evening AA group, interacting appropriately on unit.  Denies SI/HI/hallucinations at this time.  A.  Support and encouragement offered  R.  Pt remains safe on unit, will continue to monitor.

## 2013-10-15 NOTE — Tx Team (Signed)
Initial Interdisciplinary Treatment Plan  PATIENT STRENGTHS: (choose at least two) Ability for insight Motivation for treatment/growth  PATIENT STRESSORS: Financial difficulties Substance abuse   PROBLEM LIST: Problem List/Patient Goals Date to be addressed Date deferred Reason deferred Estimated date of resolution  Depression 10/15/2013     Substance abuse 10/15/2013     SI 10/15/2013                                          DISCHARGE CRITERIA:  Ability to meet basic life and health needs Adequate post-discharge living arrangements Need for constant or close observation no longer present  PRELIMINARY DISCHARGE PLAN: Placement in alternative living arrangements  PATIENT/FAMIILY INVOLVEMENT: This treatment plan has been presented to and reviewed with the patient, Arty BaumgartnerJody Derks, and/or family member.  The patient and family have been given the opportunity to ask questions and make suggestions.  Floyce StakesGarrison, Jacqueline 10/15/2013, 3:37 AM

## 2013-10-15 NOTE — Progress Notes (Signed)
Patient admitted voluntarily for worsening depression and si with no plan. He reports that his father died May 2015. He reports that he is currently homeless, has no support system and lost his insurance about 6 months ago. 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 He reports occasional thc use and his uds was + for thc and cocaine. He currently denies si/hi/a/v hallucinations. He is currently on no meds, has medical hx of bipolar, arthritis, anxiety and neuropathy. He was cooperative during admission, meal and fluids given. He was oriented to unit, safety maintained with 15 min checks.

## 2013-10-15 NOTE — [facility] Group Notes (Signed)
[facility] Group Notes:  (Nursing/MHT/Case Management/Adjunct)  Date:  10/15/2013  Time:  2:20 PM  Type of Therapy:  Psychoeducational Skills  Participation Level:  Active  Participation Quality:  Appropriate  Affect:  Appropriate  Cognitive:  Appropriate  Insight:  Appropriate  Engagement in Group:  Engaged  Modes of Intervention:  Discussion  Summary of Progress/Problems: Pt did attend self inventory group, pt reported that he was negative SI/HI, no AH/VH noted. Pt rated his depression as a 6, and his helplessness/hopelessness as a 6.     Pt reported concerns about withdrawals from herion, pt advised that the doctor will be made aware.   Jacquelyne BalintForrest, Shalita Shanta 10/15/2013, 2:20 PM

## 2013-10-15 NOTE — Progress Notes (Signed)
Patient did attend the evening speaker AA meeting.  

## 2013-10-15 NOTE — [facility] Group Notes (Signed)
[facility] Group Notes:  (Nursing/MHT/Case Management/Adjunct)  Date:  10/15/2013  Time:  5:38 PM  Type of Therapy:  Rec Therapy  Participation Level:  Did Not Attend  Summary of Progress/Problems: Unable to access gym due to floors being waxed and could not take outside due to rain.    Jacquelyne BalintForrest, Shalita Shanta 10/15/2013, 5:38 PM

## 2013-10-16 DIAGNOSIS — F1410 Cocaine abuse, uncomplicated: Secondary | ICD-10-CM | POA: Diagnosis present

## 2013-10-16 NOTE — [facility] Group Notes (Signed)
[facility] Group Notes: (Clinical Social Work)   10/16/2013      Type of Therapy:  Group Therapy   Participation Level:  Did Not Attend - Came in for a few minutes at midway point of group, then left again.   Ambrose MantleMareida Grossman-Orr, LCSW 10/16/2013, 12:42 PM

## 2013-10-16 NOTE — Progress Notes (Signed)
Adult Psychoeducational Group Note  Date:  10/16/2013 Time:  3:15PM  Group Topic/Focus:  Personal Choices and Values:   The focus of this group is to help patients assess and explore the importance of values in their lives, how their values affect their decisions, how they express their values and what opposes their expression.  Participation Level:  Did Not Attend  Additional Comments:  Pt did not attend group. Pt was in the bed asleep during group time  Avriel Kandel K 10/16/2013, 4:04 PM

## 2013-10-16 NOTE — [facility] Group Notes (Signed)
[facility] Group Notes:  (Nursing/MHT/Case Management/Adjunct)  Date:  10/16/2013  Time:  2:25 PM  Type of Therapy:  Psychoeducational Skills  Participation Level:  Did Not Attend   John Christian, John Christian 10/16/2013, 2:25 PM 

## 2013-10-16 NOTE — Progress Notes (Signed)
Patient ID: John BristoArty Baumgartnerw, male   DOB: [birth year], 28 y.o.   MRN: [id number]   D: Pt has been very flat and depressed on the unit today, he has been very isolative and has been in the bed all day. Pt refused all medications he reported that he was good and that he did not need anything. Pt did not attend any groups and has not engaged in treatment. Pt reported being negative SI/HI, no AH/VH noted. A: 15 min checks continued for patient safety. R: Pt safety maintained.

## 2013-10-16 NOTE — Progress Notes (Signed)
[facility]  10/16/2013 1:56 PM John BaumgartnerJody Christian  MRN:  [id number] Subjective:  Still not doing well. Dealing with the aches and the pains. He is also experiencing mood instability. Has some irritability low tolerance to the noise and the arguing he perceived going on at the group. "had to walk out." Diagnosis:   DSM5: Schizophrenia Disorders:  none Obsessive-Compulsive Disorders:  none Trauma-Stressor Disorders:  none Substance/Addictive Disorders:  Opioid Disorder - Severe (304.00) Depressive Disorders:  Major Depressive Disorder - Moderate (296.22) Total Time spent with patient: 30 minutes  Axis I: Mood Disorder NOS and Substance Induced Mood Disorder  ADL's:  Intact  Sleep: Fair  Appetite:  Poor  Suicidal Ideation:  Plan:  denies Intent:  denies Means:  denies Homicidal Ideation:  Plan:  denies Intent:  denies Means:  denies AEB (as evidenced by):  Psychiatric Specialty Exam: Physical Exam  Review of Systems  Constitutional: Positive for malaise/fatigue.  HENT: Negative.   Eyes: Negative.   Respiratory: Negative.   Cardiovascular: Negative.   Gastrointestinal: Negative.   Genitourinary: Negative.   Musculoskeletal: Positive for myalgias.  Skin: Negative.   Neurological: Positive for weakness.  Endo/Heme/Allergies: Negative.   Psychiatric/Behavioral: Positive for substance abuse. The patient is nervous/anxious.     Blood pressure 104/69, pulse 56, temperature 97.6 F (36.4 C), temperature source Oral, resp. rate 16, height 5\' 10"  (1.778 m), weight 67.586 kg (149 lb), SpO2 98.00%.Body mass index is 21.38 kg/(m^2).  General Appearance: Disheveled  Eye SolicitorContact::  Fair  Speech:  Clear and Coherent  Volume:  Normal  Mood:  Anxious, Irritable and worried  Affect:  anxious, worried, irritated  Thought Process:  Coherent and Goal Directed  Orientation:  Full (Time, Place, and Person)  Thought Content:  symptoms, worries, concerns  Suicidal Thoughts:  No   Homicidal Thoughts:  No  Memory:  Fair  Judgement:  Fair  Insight:  Present  Psychomotor Activity:  Restlessness  Concentration:  Fair  Recall:  Fair  Fund of Knowledge:NA  Language: Fair  Akathisia:  No  Handed:    AIMS (if indicated):     Assets:  Desire for Improvement  Sleep:  Number of Hours: 6   Musculoskeletal: Strength & Muscle Tone: within normal limits Gait & Station: normal Patient leans: N/A  Current Medications: Current Facility-Administered Medications  Medication Dose Route Frequency Provider Last Rate Last Dose  . cloNIDine (CATAPRES) tablet 0.1 mg  0.1 mg Oral QID Rachael FeeIrving A Lugo, MD   0.1 mg at 10/15/13 1812   Followed by  . [START ON 10/17/2013] cloNIDine (CATAPRES) tablet 0.1 mg  0.1 mg Oral BH-qamhs Rachael FeeIrving A Lugo, MD       Followed by  . [START ON 10/19/2013] cloNIDine (CATAPRES) tablet 0.1 mg  0.1 mg Oral QAC breakfast Rachael FeeIrving A Lugo, MD      . dicyclomine (BENTYL) tablet 20 mg  20 mg Oral Q6H PRN Rachael FeeIrving A Lugo, MD      . hydrOXYzine (ATARAX/VISTARIL) tablet 25 mg  25 mg Oral Q6H PRN Rachael FeeIrving A Lugo, MD      . ibuprofen (ADVIL,MOTRIN) tablet 600 mg  600 mg Oral Q8H PRN Kristeen MansFran E Hobson, NP      . loperamide (IMODIUM) capsule 2-4 mg  2-4 mg Oral PRN Rachael FeeIrving A Lugo, MD      . magnesium hydroxide (MILK OF MAGNESIA) suspension 30 mL  30 mL Oral Daily PRN Kristeen MansFran E Hobson, NP      . methocarbamol (ROBAXIN) tablet 500  mg  500 mg Oral Q8H PRN Rachael FeeIrving A Lugo, MD      . naproxen (NAPROSYN) tablet 500 mg  500 mg Oral BID PRN Rachael FeeIrving A Lugo, MD      . ondansetron (ZOFRAN-ODT) disintegrating tablet 4 mg  4 mg Oral Q6H PRN Rachael FeeIrving A Lugo, MD      . traZODone (DESYREL) tablet 25 mg  25 mg Oral QHS Kristeen MansFran E Hobson, NP        Lab Results: No results found for this or any previous visit (from the past 48 hour(s)).  Physical Findings: AIMS: Facial and Oral Movements Muscles of Facial Expression: None, normal Lips and Perioral Area: None, normal Jaw: None, normal Tongue: None,  normal,Extremity Movements Upper (arms, wrists, hands, fingers): None, normal Lower (legs, knees, ankles, toes): None, normal, Trunk Movements Neck, shoulders, hips: None, normal, Overall Severity Severity of abnormal movements (highest score from questions above): None, normal Incapacitation due to abnormal movements: None, normal Patient's awareness of abnormal movements (rate only patient's report): No Awareness, Dental Status Current problems with teeth and/or dentures?: No Does patient usually wear dentures?: No  CIWA:  CIWA-Ar Total: 0 COWS:  COWS Total Score: 0  Treatment Plan Summary: Daily contact with patient to assess and evaluate symptoms and progress in treatment Medication management  Plan: Supportive approach/coping skills/relapse prevention           Reassess and address the co morbidities           Pursue the detox           Explore rehab options  Medical Decision Making Problem Points:  Review of psycho-social stressors (1) Data Points:  Review of medication regiment & side effects (2)  I certify that inpatient services furnished can reasonably be expected to improve the patient's condition.   LUGO,IRVING A 10/16/2013, 1:56 PM

## 2013-10-16 NOTE — Progress Notes (Signed)
D.  Pt reports frustration with not yet being ordered an anti-depressant.  Pt states that the whole reason he came in was his depression and to seek help for this.  Pt states he has a history of drug abuse but that it is not recent, and what he has done has been directly related to his depression which he feels is the underlying issue.  Pt denies adamantly any signs or symptoms of withdrawal.  Pt denies SI/HI/hallucinaitons and is interacting appropriately with peers on unit.  Pt did attend evening AA group.  Pt hopes to speak to doctor and treatment team about his concerns tomorrow. PT is declining clonidine.   A.  Support and encouragement offered.  Will pass on Pt's concerns to day shift in AM.  R.  Pt remains safe on unit, will continue to monitor.

## 2013-10-16 NOTE — [facility] Group Notes (Signed)
[facility] Group Notes:  (Nursing/MHT/Case Management/Adjunct)  Date:  10/16/2013  Time:  1:07 PM  Type of Therapy:  Psychoeducational Skills  Participation Level:  Did Not Attend  Buford DresserForrest, Corderro Koloski Shanta 10/16/2013, 1:07 PM

## 2013-10-16 NOTE — ED Provider Notes (Signed)
Medical screening examination/treatment/procedure(s) were performed by non-physician practitioner and as supervising physician I was immediately available for consultation/collaboration.   John ChurnJohn David Wofford III, MD 10/16/13 [phone]

## 2013-10-16 NOTE — Progress Notes (Signed)
Patient did attend the evening speaker AA meeting.  

## 2013-10-17 DIAGNOSIS — F1994 Other psychoactive substance use, unspecified with psychoactive substance-induced mood disorder: Secondary | ICD-10-CM

## 2013-10-17 DIAGNOSIS — F39 Unspecified mood [affective] disorder: Secondary | ICD-10-CM

## 2013-10-17 NOTE — Progress Notes (Signed)
Patient ID: John BaumgartnerJody Christian, male   DOB: 1985/08/15, 28 y.o.   MRN: [id number] D- Patient reports he slept well.  His appetite is good.  His energy level is normal and his ability to pay attention is good.  He is rating depression at 2/10 and anxiety at 3/10.  He denies thoughts of self harm.  A- supported patient.  R- patient says he feels ready to leave.  Encouraged patient to talk with MD about his grief regarding his father's death.

## 2013-10-17 NOTE — [facility] Group Notes (Signed)
[facility]   10/17/2013 10:23 AM  Participation Quality:  DID NOT ATTEND-pt in bed sleeping during group time.   Smart, American FinancialHeather LCSWA

## 2013-10-17 NOTE — Plan of Care (Signed)
Problem: Ineffective individual coping Goal: LTG: Patient will report a decrease in negative feelings Outcome: Progressing Patient says he has been talking about his father's death and is feeling more positive

## 2013-10-17 NOTE — [facility] Group Notes (Signed)
[facility] LCSW Group Therapy  10/17/2013 3:17 PM  Type of Therapy:  Group Therapy  Participation Level:  Did Not Attend-pt in room sleeping during group.   Smart, Heather LCSWA  10/17/2013, 3:17 PM

## 2013-10-17 NOTE — Progress Notes (Signed)
Adult Psychoeducational Group Note  Date:  10/17/2013 Time:  10:00 am  Group Topic/Focus:  Wellness Toolbox:   The focus of this group is to discuss various aspects of wellness, balancing those aspects and exploring ways to increase the ability to experience wellness.  Patients will create a wellness toolbox for use upon discharge.  Participation Level:  Active  Participation Quality:  Appropriate, Sharing and Supportive  Affect:  Appropriate  Cognitive:  Appropriate  Insight: Appropriate  Engagement in Group:  Engaged  Modes of Intervention:  Discussion, Education, Socialization and Support  Additional Comments:  Pt stated that he needs to stop hanging around negative people to assist him with living a more healthy lifestyle. Pt was unable to identify any barriers to his progress.   [name], John 10/17/2013, 10:54 AM

## 2013-10-17 NOTE — Progress Notes (Signed)
[facility] Progress [facility]  10/17/2013 12:40 PM Arty BaumgartnerJody Christian  MRN:  [id number] Subjective:  John GambleJody admits he gets upset very easily.. Has experienced persistent mood instability. States he has been able to deal with this by talking to other patients. . States that he did not use to talk about himself, about his feelings, for what states that this in itself  a gain. He has some work lined up when he gets out of here. He is going to be able to stay in a safe place. He states he has not completely R/O going into rehab but that he needs to make the money in order to pay his child support. Concerned that they might lock him up if he does not have the money. States he hopes to be able to abstain.  Diagnosis:   DSM5: Schizophrenia Disorders:  none Obsessive-Compulsive Disorders:  none Trauma-Stressor Disorders:  none Substance/Addictive Disorders:  Opioid Disorder - Severe (304.00), Cocaine Use Disorder Depressive Disorders:  Major Depressive Disorder - Moderate (296.22) Total Time spent with patient: 30 minutes  Axis I: Mood Disorder NOS  ADL's:  Intact  Sleep: Fair  Appetite:  Fair  Suicidal Ideation:  Plan:  denies Intent:  denies Means:  denies Homicidal Ideation:  Plan:  denies Intent:  denies Means:  denies AEB (as evidenced by):  Psychiatric Specialty Exam: Physical Exam  Review of Systems  Constitutional: Positive for malaise/fatigue.  HENT: Negative.   Eyes: Negative.   Respiratory: Negative.   Cardiovascular: Negative.   Gastrointestinal: Negative.   Genitourinary: Negative.   Musculoskeletal: Positive for myalgias.  Skin: Negative.   Neurological: Negative.   Endo/Heme/Allergies: Negative.   Psychiatric/Behavioral: Positive for substance abuse. The patient is nervous/anxious.     Blood pressure 112/76, pulse 69, temperature 97.5 F (36.4 C), temperature source Oral, resp. rate 18, height 5\' 10"  (1.778 m), weight 67.586 kg (149 lb), SpO2 98.00%.Body mass index is 21.38  kg/(m^2).  General Appearance: Fairly Groomed  Patent attorneyye Contact::  Fair  Speech:  Clear and Coherent  Volume:  fluctuates  Mood:  Anxious, Irritable and worried  Affect:  Labile  Thought Process:  Coherent and Goal Directed  Orientation:  Full (Time, Place, and Person)  Thought Content:  symptoms, worries, concerns  Suicidal Thoughts:  No  Homicidal Thoughts:  No  Memory:  Immediate;   Fair Recent;   Fair Remote;   Fair  Judgement:  Fair  Insight:  Present  Psychomotor Activity:  Restlessness  Concentration:  Fair  Recall:  [employer]Fair  Fund of Knowledge:NA  Language: Fair  Akathisia:  No  Handed:    AIMS (if indicated):     Assets:  Desire for Improvement Social Support  Sleep:  Number of Hours: 6   Musculoskeletal: Strength & Muscle Tone: within normal limits Gait & Station: normal Patient leans: N/A  Current Medications: Current Facility-Administered Medications  Medication Dose Route Frequency Provider Last Rate Last Dose  . cloNIDine (CATAPRES) tablet 0.1 mg  0.1 mg Oral BH-qamhs Rachael FeeIrving A Lugo, MD       Followed by  . [START ON 10/19/2013] cloNIDine (CATAPRES) tablet 0.1 mg  0.1 mg Oral QAC breakfast Rachael FeeIrving A Lugo, MD      . dicyclomine (BENTYL) tablet 20 mg  20 mg Oral Q6H PRN Rachael FeeIrving A Lugo, MD      . hydrOXYzine (ATARAX/VISTARIL) tablet 25 mg  25 mg Oral Q6H PRN Rachael FeeIrving A Lugo, MD      . ibuprofen (ADVIL,MOTRIN) tablet 600 mg  600  mg Oral Q8H PRN Kristeen MansFran E Hobson, NP      . loperamide (IMODIUM) capsule 2-4 mg  2-4 mg Oral PRN Rachael FeeIrving A Lugo, MD      . magnesium hydroxide (MILK OF MAGNESIA) suspension 30 mL  30 mL Oral Daily PRN Kristeen MansFran E Hobson, NP      . methocarbamol (ROBAXIN) tablet 500 mg  500 mg Oral Q8H PRN Rachael FeeIrving A Lugo, MD      . naproxen (NAPROSYN) tablet 500 mg  500 mg Oral BID PRN Rachael FeeIrving A Lugo, MD      . ondansetron (ZOFRAN-ODT) disintegrating tablet 4 mg  4 mg Oral Q6H PRN Rachael FeeIrving A Lugo, MD      . traZODone (DESYREL) tablet 25 mg  25 mg Oral QHS Kristeen MansFran E Hobson, NP         Lab Results: No results found for this or any previous visit (from the past 48 hour(s)).  Physical Findings: AIMS: Facial and Oral Movements Muscles of Facial Expression: None, normal Lips and Perioral Area: None, normal Jaw: None, normal Tongue: None, normal,Extremity Movements Upper (arms, wrists, hands, fingers): None, normal Lower (legs, knees, ankles, toes): None, normal, Trunk Movements Neck, shoulders, hips: None, normal, Overall Severity Severity of abnormal movements (highest score from questions above): None, normal Incapacitation due to abnormal movements: None, normal Patient's awareness of abnormal movements (rate only patient's report): No Awareness, Dental Status Current problems with teeth and/or dentures?: No Does patient usually wear dentures?: No  CIWA:  CIWA-Ar Total: 2 COWS:  COWS Total Score: 0  Treatment Plan Summary: Daily contact with patient to assess and evaluate symptoms and progress in treatment Medication management  Plan: Supportive approach/coping skills/relapse prevention           CBT;mindfulness, anger management  Medical Decision Making Problem Points:  Review of psycho-social stressors (1) Data Points:  Review of medication regiment & side effects (2)  I certify that inpatient services furnished can reasonably be expected to improve the patient's condition.   LUGO,IRVING A 10/17/2013, 12:40 PM

## 2013-10-18 MED ORDER — HYDROXYZINE HCL 25 MG PO TABS
25.0000 mg | ORAL_TABLET | Freq: Three times a day (TID) | ORAL | Status: DC | PRN
  Filled 2013-10-18: qty 6

## 2013-10-18 MED ORDER — TRAZODONE 25 MG HALF TABLET: 25.0000 mg | ORAL_TABLET | Freq: Every day | ORAL | Status: DC

## 2013-10-18 MED ORDER — TRAZODONE 25 MG HALF TABLET: 50.0000 mg | ORAL_TABLET | Freq: Every day | ORAL | Status: AC

## 2013-10-18 MED ORDER — HYDROXYZINE HCL 25 MG PO TABS: ORAL_TABLET | ORAL | Status: AC

## 2013-10-18 NOTE — Progress Notes (Signed)
   Pt laying in bed resting with eyes closed. Respirations even and unlabored. No distress noted. 15 min checks continued for safety. 

## 2013-10-18 NOTE — Progress Notes (Signed)
[facility] Discharge Plan :  Will you be returning to the same living situation after discharge: No. staying with a friend in [place].  At discharge, do you have transportation home?:Yes,  part bus/bus Do you have the ability to pay for your medications:Yes,  mental health  Release of information consent forms completed and submitted to Medical Records by CSW.  Patient to Follow up at: Follow-up Information   Follow up with [facility] On 10/19/2013. (Appt for hospital followup at 9:00AM with Waldon MerlKarissa. Make sure to call/reschedule TODAY if necessary. )    Contact information:   [address] [place], [place] [address] Phone: [phone] Fax: [phone]      Patient denies SI/HI:   Yes,  during self report    Safety Planning and Suicide Prevention discussed:  Yes,  Pt refused to consent to family contact. SPE completed with pt. SPI pamphlet provided to pt and he was encouraged to share information with support network, ask questions, and talk about any concerns relating to SPE.  Smart, Heather LCSWA  10/18/2013, 10:36 AM

## 2013-10-18 NOTE — Discharge Summary (Signed)
Physician Discharge Summary Note  Patient:  John Christian is an 28 y.o., male MRN:  [id number] DOB:  [birth year] Patient phone:  [phone] (home)  Patient address:   [address] [place] [address],  Total Time spent with patient: Greater than 30 minutes  Date of Admission:  10/15/2013 Date of Discharge: 10/18/13  Reason for Admission: Opioid detox  Discharge Diagnoses: Active Problems:   Opioid dependence   Substance induced mood disorder   Moderate major depression, single episode   Cocaine abuse   Psychiatric Specialty Exam: Physical Exam  Psychiatric: His speech is normal and behavior is normal. Judgment and thought content normal. His mood appears not anxious. His affect is not angry, not blunt, not labile and not inappropriate. Cognition and memory are normal. He does not exhibit a depressed mood.    Review of Systems  Constitutional: Negative.   HENT: Negative.   Eyes: Negative.   Respiratory: Negative.   Cardiovascular: Negative.   Gastrointestinal: Negative.   Genitourinary: Negative.   Musculoskeletal: Negative.   Skin: Negative.   Neurological: Negative.   Endo/Heme/Allergies: Negative.   Psychiatric/Behavioral: Positive for depression (Stable) and substance abuse (Opioid dependence). Negative for suicidal ideas, hallucinations and memory loss. The patient has insomnia (Stable). The patient is not nervous/anxious.     Blood pressure 113/74, pulse 77, temperature 97.8 F (36.6 C), temperature source Oral, resp. rate 16, height 5\' 10"  (1.778 m), weight 67.586 kg (149 lb), SpO2 98.00%.Body mass index is 21.38 kg/(m^2).   General Appearance: Fairly Groomed   Patent attorney:: Fair   Speech: Clear and Coherent   Volume: Normal   Mood: Euthymic   Affect: Appropriate   Thought Process: Coherent and Goal Directed   Orientation: Full (Time, Place, and Person)   Thought Content: plans as he moves on, relapse prevention plan   Suicidal Thoughts: No    Homicidal Thoughts: No   Memory: Immediate; Fair  Recent; Fair  Remote; Fair   Judgement: Fair   Insight: Present   Psychomotor Activity: Normal   Concentration: Fair   Recall: [employer] of Knowledge:NA   Language: Fair   Akathisia: No   Handed:   AIMS (if indicated):   Assets: Desire for Improvement  Housing  Social Support  Vocational/Educational   Sleep: Number of Hours: 6    Past Psychiatric History: Diagnosis: Opioid dependence, Cannabis abuse, Major depressive disorder, moderate  Hospitalizations: [facility] adult unit  Outpatient Care: [facility] in [place], [place]  Substance Abuse Care: [facility] in [place], [place]  Self-Mutilation: NA  Suicidal Attempts: NA  Violent Behaviors: NA   Musculoskeletal: Strength & Muscle Tone: within normal limits Gait & Station: normal Patient leans: N/A  DSM5: Schizophrenia Disorders:  NA Obsessive-Compulsive Disorders:  NA Trauma-Stressor Disorders:  NA Substance/Addictive Disorders:  Opioid Disorder - Severe (304.00), Cocaine abuse Depressive Disorders:  Major Depressive Disorder - Moderate (296.22)  Axis Diagnosis:   AXIS I:  Opioid dependence, Cannabis abuse, Major depressive disorder, moderate AXIS II:  Deferred AXIS III:   Past Medical History  Diagnosis Date  . Arthritis   . Anxiety   . Neuropathy    AXIS IV:  other psychosocial or environmental problems and Polysubstance dependence AXIS V:  63  Level of Care:  OP  Hospital Course:  Mr. Smelser is a 28 year old caucasian male who presents to the emergency department with suicidal ideations. States he has had constant intrusive thoughts about harming himself and feels unsafe. States multiple recent stressors including  financial difficulty, loss of a job and homelessness. Patient states that he has difficulty sleeping, has not been eating and endorses significant worthlessness, sadness, anhedonia and guilt.   Besides feeling very depressed and suicidal, Augusto GambleJody also  has substance abuse problems. He has opioid dependency and abuses cocaine as well. And while a patient in this hospital, Clark received clonidine detox protocols for opioid detox. He was also medicated and discharged on Trazodone 25 mg q bedtime for sleep and Hydroxyzine 25 mg three times daily as needed for anxiety. He was enrolled in the group counseling sessions and AA/NA meeting being offered on this unit. He was taught and learned coping skills.  Augusto GambleJody has completed detox treatment and his mood is stable. This is evidenced by his reports of improved mood and absence of withdrawal symptoms. He is currently being discharged to continue psychiatric treatment at [facility] [place], [place]. He is provided with all the pertinent information required to make this appointment without problems. Upon discharge, he adamantly denies any SIHI, AVH, delusional thoughts, paranoia and or withdrawal symptoms. He was provided with a 4 days worth supply samples of his [facility]. [facility] with all belongings in no distress. Transportation per bus. Adelard also threatened that he will throw away the prescriptions for his current discharge medications given to him upon dischrage.  Consults:  psychiatry  Significant Diagnostic Studies:  labs: CBC with diff, CMP, UDS, toxicology tests, U/A  Discharge Vitals:   Blood pressure 113/74, pulse 77, temperature 97.8 F (36.6 C), temperature source Oral, resp. rate 16, height 5\' 10"  (1.778 m), weight 67.586 kg (149 lb), SpO2 98.00%. Body mass index is 21.38 kg/(m^2). Lab Results:   No results found for this or any previous visit (from the past 72 hour(s)).  Physical Findings: AIMS: Facial and Oral Movements Muscles of Facial Expression: None, normal Lips and Perioral Area: None, normal Jaw: None, normal Tongue: None, normal,Extremity Movements Upper (arms, wrists, hands, fingers): None, normal Lower (legs, knees, ankles, toes): None, normal, Trunk  Movements Neck, shoulders, hips: None, normal, Overall Severity Severity of abnormal movements (highest score from questions above): None, normal Incapacitation due to abnormal movements: None, normal Patient's awareness of abnormal movements (rate only patient's report): No Awareness, Dental Status Current problems with teeth and/or dentures?: No Does patient usually wear dentures?: No  CIWA:  CIWA-Ar Total: 0 COWS:  COWS Total Score: 0  Psychiatric Specialty Exam: See Psychiatric Specialty Exam and Suicide Risk Assessment completed by Attending Physician prior to discharge.  Discharge destination:  Home  Is patient on multiple antipsychotic therapies at discharge:  No   Has Patient had three or more failed trials of antipsychotic monotherapy by history:  No  Recommended Plan for Multiple Antipsychotic Therapies: NA    Medication List       Indication   hydrOXYzine 25 MG tablet  Commonly known as:  ATARAX/VISTARIL  Take 1 table (25 mg) three times daily as needed: For anxiety   Indication:  Tension, Anxiety     traZODone 25 mg Tabs tablet  Commonly known as:  DESYREL  Take 1 tablet (50 mg total) by mouth at bedtime. For sleep   Indication:  Trouble Sleeping       Follow-up Information   [facility] [place] On 10/19/2013. (Appt for hospital followup at 9:00AM with Waldon MerlKarissa. Make sure to call/reschedule TODAY if necessary. )    Contact information:   [address] [place], [place] [address] Phone: [phone]  Fax: [phone]     Follow-up recommendations: Activity:  As tolerated Diet: As recommended by your primary care doctor. Keep all scheduled follow-up appointments as recommended.   Comments: Take all your medications as prescribed by your mental healthcare provider. Report any adverse effects and or reactions from your medicines to your outpatient provider promptly. Patient is instructed and cautioned to not engage in alcohol and or illegal drug use  while on prescription medicines. In the event of worsening symptoms, patient is instructed to call the crisis hotline, 911 and or go to the nearest ED for appropriate evaluation and treatment of symptoms. Follow-up with your primary care provider for your other medical issues, concerns and or health care needs.   Total Discharge Time:  Greater than 30 minutes.  Signed: Armandina StammerNwoko, Agnes I, PMHNP-BC 10/18/2013, 11:33 AM I personally assessed the patient and formulated the plan Madie RenoIrving A. Dub MikesLugo, M.D.
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
## 2013-10-18 NOTE — [facility] Suicide Risk Assessment (Signed)
Suicide Risk Assessment  Discharge Assessment     Demographic Factors:  Male, Adolescent or young adult and Caucasian  Total Time spent with patient: 45 minutes  Psychiatric Specialty Exam:     Blood pressure 113/74, pulse 77, temperature 97.8 F (36.6 C), temperature source Oral, resp. rate 16, height 5\' 10"  (1.778 m), weight 67.586 kg (149 lb), SpO2 98.00%.Body mass index is 21.38 kg/(m^2).  General Appearance: Fairly Groomed  Patent attorneyye Contact::  Fair  Speech:  Clear and Coherent  Volume:  Normal  Mood:  Euthymic  Affect:  Appropriate  Thought Process:  Coherent and Goal Directed  Orientation:  Full (Time, Place, and Person)  Thought Content:  plans as he moves on, relapse prevention plan  Suicidal Thoughts:  No  Homicidal Thoughts:  No  Memory:  Immediate;   Fair Recent;   Fair Remote;   Fair  Judgement:  Fair  Insight:  Present  Psychomotor Activity:  Normal  Concentration:  Fair  Recall:  FiservFair  Fund of Knowledge:NA  Language: Fair  Akathisia:  No  Handed:    AIMS (if indicated):     Assets:  Desire for Improvement Housing Social Support Vocational/Educational  Sleep:  Number of Hours: 6.75    Musculoskeletal: Strength & Muscle Tone: within normal limits Gait & Station: normal Patient leans: N/Christian   Mental Status Per Nursing Assessment::   On Admission:  Suicidal ideation indicated by patient  Current Mental Status by Physician: In full contact with reality. There are no active S/S of withdrawal. He is willing and motivated to pursue outpatient treatment   Loss Factors: NA  Historical Factors: NA  Risk Reduction Factors:   Sense of responsibility to family, Employed and Positive social support  Continued Clinical Symptoms:  Depression:   Comorbid alcohol abuse/dependence Alcohol/Substance Abuse/Dependencies  Cognitive Features That Contribute To Risk:  Closed-mindedness Polarized thinking Thought constriction (tunnel vision)    Suicide Risk:   Minimal: No identifiable suicidal ideation.  Patients presenting with no risk factors but with morbid ruminations; may be classified as minimal risk based on the severity of the depressive symptoms  Discharge Diagnoses:   AXIS I:  Opioid Dependence, Cocaine Abuse, Substance Induced Mood disorder AXIS II:  No diagnosis AXIS III:   Past Medical History  Diagnosis Date  . Arthritis   . Anxiety   . Neuropathy    AXIS IV:  other psychosocial or environmental problems AXIS V:  61-70 mild symptoms  Plan Of Care/Follow-up recommendations:  Activity:  as tolerated Diet:  regular Follow up outpatient basis Is patient on multiple antipsychotic therapies at discharge:  No   Has Patient had three or more failed trials of antipsychotic monotherapy by history:  No  Recommended Plan for Multiple Antipsychotic Therapies: NA    John Christian John Christian Christian 10/18/2013, 10:38 AM

## 2013-10-18 NOTE — Progress Notes (Signed)
The focus of this group is to educate the patient on the purpose and policies of crisis stabilization and provide a format to answer questions about their admission.  The group details unit policies and expectations of patients while admitted. Patient attended group and participated in group exercises.  He talked about not being ready to quit smoking and talked about the new snack program.

## 2013-10-18 NOTE — Progress Notes (Signed)
Patient ID: Arty BaumgartnerJody Julio, male   DOB: 1986-02-13, 28 y.o.   MRN: [id number] Patient was discharged ambulatory to ride bus to friend's house.  He denies SI/HI.  He verbalizes understanding of his discharge meds and followup, but says he doesn't need either of the prn meds for anxiety and insomnia.  He was given scripts and a 3 day supply by MD., and agredd it might be helpful to have on hand if needed.  Belongings returned and patient reviewed his safety plan and support system.

## 2013-10-18 NOTE — Tx Team (Signed)
Interdisciplinary Treatment Plan Update (Adult)  Date: 10/18/2013   Time Reviewed: 10:29 AM  Progress in Treatment:  Attending groups: No.  Participating in groups:  No.  Taking medication as prescribed: Yes  Tolerating medication: Yes  Family/Significant othe contact made: No. Pt refused to consent to family contact. SPE completed with pt.   Patient understands diagnosis: Yes, AEB seeking treatment for suboxine detox, mood stabilization, and passive SI.  Discussing patient identified problems/goals with staff: Yes  Medical problems stabilized or resolved: Yes  Denies suicidal/homicidal ideation: Yes during self report.  Patient has not harmed self or Others: Yes  New problem(s) identified:  Discharge Plan or Barriers: Pt plans to take part bus back to [place] and stay with friend. [facility] services and will return to work immediately. Pt not interested in NA groups. CSW [place] as he reported a trigger for SI/Depression is recent death of his father.  Additional comments: Mr. John Christian is a 28 year old caucasian male who presents to the emergency department with suicidal ideations. States he has had constant intrusive thoughts about harming himself and feels unsafe. States multiple recent stressors including financial difficulty, loss of a job and homelessness. Patient states that he has difficulty sleeping, has not been eating and endorses significant worthlessness, sadness, anhedonia and guilt.  Patient has been homeless since his fathers' death.  Patient's father passed away one month ago and mother has had a reoccurrence of her cancer which is weighing heavily on him currently. Reason for Continuation of Hospitalization: d/c today  Estimated length of stay: d/c today  For review of initial/current patient goals, please see plan of care.  Attendees:  Patient:    Family:    Physician: Geoffery LyonsIrving  Lugo MD 10/18/2013 10:28 AM   Nursing: Lupita Leashonna RN  10/18/2013 10:29 AM   Clinical Social Worker Adyson Vanburen Smart, LCSWA  10/18/2013 10:29 AM   Other: Jan RN  10/18/2013 10:29 AM   Other:    Other: Carol RN  10/18/2013 10:29 AM   Other:    Scribe for Treatment Team:  The Sherwin-WilliamsHeather Smart LCSWA 10/18/2013 10:29 AM

## 2013-10-18 NOTE — [facility] Suicide Risk Assessment (Signed)
[facility] INPATIENT:  Family/Significant Other Suicide Prevention Education  Suicide Prevention Education:  Patient Refusal for Family/Significant Other Suicide Prevention Education: The patient John BaumgartnerJody Amason has refused to provide written consent for family/significant other to be provided Family/Significant Other Suicide Prevention Education during admission and/or prior to discharge.  Physician notified.  SPE completed with pt. SPI pamphlet provided to pt and he was encouraged to share information with support network, ask questions, and talk about any concerns relating to SPE.  Smart, Heather LCSWA 10/18/2013, 9:45 AM

## 2013-10-20 NOTE — Progress Notes (Signed)
Patient Discharge Instructions:  After Visit Summary (AVS):   Faxed to:  10/20/13 Discharge Summary Note:   Faxed to:  10/20/13 Psychiatric Admission Assessment Note:   Faxed to:  10/20/13 Suicide Risk Assessment - Discharge Assessment:   Faxed to:  10/20/13 Faxed/Sent to the Next Level Care provider:  10/20/13 [facility] @ [phone]  Jerelene ReddenSheena E [place], 10/20/2013, 4:06 PM

## 2013-11-08 ENCOUNTER — Emergency Department: Payer: Self-pay | Admitting: Emergency Medicine

## 2013-11-09 ENCOUNTER — Emergency Department: Payer: Self-pay | Admitting: Emergency Medicine

## 2013-11-09 LAB — COMPREHENSIVE METABOLIC PANEL
ALBUMIN: 3.8 g/dL (ref 3.4–5.0)
ALK PHOS: 56 U/L
ALT: 16 U/L
ANION GAP: 9 (ref 7–16)
AST: 27 U/L (ref 15–37)
BUN: 14 mg/dL (ref 7–18)
Bilirubin,Total: 0.6 mg/dL (ref 0.2–1.0)
Calcium, Total: 8.7 mg/dL (ref 8.5–10.1)
Chloride: 104 mmol/L (ref 98–107)
Co2: 25 mmol/L (ref 21–32)
Creatinine: 1.17 mg/dL (ref 0.60–1.30)
Glucose: 75 mg/dL (ref 65–99)
Osmolality: 275 (ref 275–301)
POTASSIUM: 3.6 mmol/L (ref 3.5–5.1)
Sodium: 138 mmol/L (ref 136–145)
Total Protein: 6.8 g/dL (ref 6.4–8.2)

## 2013-11-09 LAB — URINALYSIS, COMPLETE
BILIRUBIN, UR: NEGATIVE
Bacteria: NONE SEEN
Glucose,UR: NEGATIVE mg/dL (ref 0–75)
Leukocyte Esterase: NEGATIVE
Nitrite: NEGATIVE
Ph: 5 (ref 4.5–8.0)
Protein: NEGATIVE
Specific Gravity: 1.026 (ref 1.003–1.030)
Squamous Epithelial: NONE SEEN
WBC UR: <1 /HPF (ref 0–5)

## 2013-11-09 LAB — SALICYLATE LEVEL: Salicylates, Serum: 3.5 mg/dL — ABNORMAL HIGH

## 2013-11-09 LAB — DRUG SCREEN, URINE
AMPHETAMINES, UR SCREEN: NEGATIVE (ref ?–1000)
Barbiturates, Ur Screen: NEGATIVE (ref ?–200)
Benzodiazepine, Ur Scrn: POSITIVE (ref ?–200)
CANNABINOID 50 NG, UR ~~LOC~~: POSITIVE (ref ?–50)
Cocaine Metabolite,Ur ~~LOC~~: POSITIVE (ref ?–300)
MDMA (Ecstasy)Ur Screen: NEGATIVE (ref ?–500)
METHADONE, UR SCREEN: NEGATIVE (ref ?–300)
Opiate, Ur Screen: POSITIVE (ref ?–300)
Phencyclidine (PCP) Ur S: NEGATIVE (ref ?–25)
Tricyclic, Ur Screen: NEGATIVE (ref ?–1000)

## 2013-11-09 LAB — CBC
HCT: 37.4 % — ABNORMAL LOW (ref 40.0–52.0)
HGB: 12.5 g/dL — AB (ref 13.0–18.0)
MCH: 31.7 pg (ref 26.0–34.0)
MCHC: 33.5 g/dL (ref 32.0–36.0)
MCV: 95 fL (ref 80–100)
PLATELETS: 181 10*3/uL (ref 150–440)
RBC: 3.95 10*6/uL — ABNORMAL LOW (ref 4.40–5.90)
RDW: 13.8 % (ref 11.5–14.5)
WBC: 9.5 10*3/uL (ref 3.8–10.6)

## 2013-11-09 LAB — ACETAMINOPHEN LEVEL: ACETAMINOPHEN: 9 ug/mL — AB

## 2013-11-09 LAB — ETHANOL: Ethanol: <3 mg/dL

## 2014-08-12 NOTE — Consult Note (Signed)
Brief Consult Note: Diagnosis: Polysubstance Dependence, Substance Induced Mood DO.   Patient was seen by consultant.   Recommend further assessment or treatment.   Comments: Pt seen for follow up. he was yellying and loud in the ED. he [facility] keep his promise to return to him in 5 mins. he stated that he was always treated in the similar manner and he feels "disrespected'. He stated that he will be calm now and will not take meds that will make him sleepy. He agreed to watch TV. Security Staff was also present there. He is awaiting [facility] placement  Plan Pt has long h/o drug use and I will start him on Librium 25mg  po q6hrs to prevent w/d from Benzo.  Continue Suboxone Awaiting [facility] placement Will continue to monitor.  Electronic Signatures: Rhunette CroftFaheem, Tilmon Wisehart S (MD)  (Signed 23-Jul-15 12:12)  Authored: Brief Consult Note   Last Updated: 23-Jul-15 12:12 by Rhunette CroftFaheem, Altan Kraai S (MD)
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
